# Patient Record
Sex: Male | Born: 1970 | Race: White | Hispanic: No | Marital: Single | Smoking: Former smoker
Health system: Southern US, Community
[De-identification: ages and names within clinical notes are randomized; demographics above are authoritative.]

## PROBLEM LIST (undated history)

## (undated) DIAGNOSIS — F32A Depression, unspecified: Secondary | ICD-10-CM

## (undated) DIAGNOSIS — F329 Major depressive disorder, single episode, unspecified: Secondary | ICD-10-CM

## (undated) DIAGNOSIS — F1910 Other psychoactive substance abuse, uncomplicated: Secondary | ICD-10-CM

## (undated) DIAGNOSIS — K4090 Unilateral inguinal hernia, without obstruction or gangrene, not specified as recurrent: Secondary | ICD-10-CM

## (undated) DIAGNOSIS — J942 Hemothorax: Secondary | ICD-10-CM

## (undated) HISTORY — PX: INCISION AND DRAINAGE ABSCESS: SHX5864

---

## 2008-11-16 ENCOUNTER — Emergency Department (HOSPITAL_COMMUNITY): Admission: EM | Admit: 2008-11-16 | Discharge: 2008-11-17 | Payer: Self-pay | Admitting: Emergency Medicine

## 2008-11-17 ENCOUNTER — Encounter (INDEPENDENT_AMBULATORY_CARE_PROVIDER_SITE_OTHER): Payer: Self-pay | Admitting: Otolaryngology

## 2008-11-17 ENCOUNTER — Inpatient Hospital Stay (HOSPITAL_COMMUNITY): Admission: RE | Admit: 2008-11-17 | Discharge: 2008-11-20 | Payer: Self-pay | Admitting: Otolaryngology

## 2009-12-22 ENCOUNTER — Emergency Department (HOSPITAL_COMMUNITY): Admission: EM | Admit: 2009-12-22 | Discharge: 2009-12-22 | Payer: Self-pay | Admitting: Family Medicine

## 2010-05-18 LAB — BASIC METABOLIC PANEL
BUN: 14 mg/dL (ref 6–23)
CO2: 26 mEq/L (ref 19–32)
CO2: 27 mEq/L (ref 19–32)
Calcium: 8.7 mg/dL (ref 8.4–10.5)
Calcium: 9.3 mg/dL (ref 8.4–10.5)
Chloride: 103 mEq/L (ref 96–112)
Creatinine, Ser: 0.68 mg/dL (ref 0.4–1.5)
GFR calc Af Amer: >60 mL/min (ref >60)
GFR calc non Af Amer: >60 mL/min (ref >60)
GFR calc non Af Amer: >60 mL/min (ref >60)
Glucose, Bld: 89 mg/dL (ref 70–99)
Potassium: 3.4 mEq/L — ABNORMAL LOW (ref 3.5–5.1)

## 2010-05-18 LAB — CULTURE, ROUTINE-ABSCESS

## 2010-05-18 LAB — CBC
HCT: 35.3 % — ABNORMAL LOW (ref 39.0–52.0)
Hemoglobin: 12.3 g/dL — ABNORMAL LOW (ref 13.0–17.0)
Hemoglobin: 12.8 g/dL — ABNORMAL LOW (ref 13.0–17.0)
MCV: 90.6 fL (ref 78.0–100.0)
Platelets: 180 10*3/uL (ref 150–400)
Platelets: 198 10*3/uL (ref 150–400)
RBC: 3.9 MIL/uL — ABNORMAL LOW (ref 4.22–5.81)
RDW: 13 % (ref 11.5–15.5)
WBC: 12.4 10*3/uL — ABNORMAL HIGH (ref 4.0–10.5)

## 2010-05-18 LAB — DIFFERENTIAL
Eosinophils Absolute: 0 10*3/uL (ref 0.0–0.7)
Monocytes Relative: 3 % (ref 3–12)
Neutrophils Relative %: 88 % — ABNORMAL HIGH (ref 43–77)

## 2010-05-18 LAB — ANAEROBIC CULTURE

## 2011-03-29 ENCOUNTER — Encounter (HOSPITAL_COMMUNITY): Payer: Self-pay | Admitting: Emergency Medicine

## 2011-03-29 ENCOUNTER — Emergency Department (HOSPITAL_COMMUNITY)
Admission: EM | Admit: 2011-03-29 | Discharge: 2011-03-29 | Discharge status: Against Medical Advice | Payer: Medicaid Other | Attending: Emergency Medicine | Admitting: Emergency Medicine

## 2011-03-29 DIAGNOSIS — R1110 Vomiting, unspecified: Secondary | ICD-10-CM | POA: Insufficient documentation

## 2011-03-29 MED ORDER — SODIUM CHLORIDE 0.9 % IV BOLUS (SEPSIS): 1000.0000 mL | Freq: Once | INTRAVENOUS | Status: DC

## 2011-03-29 MED ORDER — ONDANSETRON HCL 4 MG/2ML IJ SOLN: 4.0000 mg | Freq: Once | INTRAMUSCULAR | Status: DC

## 2011-03-29 MED ORDER — SODIUM CHLORIDE 0.9 % IV SOLN: Freq: Once | INTRAVENOUS | Status: DC

## 2011-03-29 NOTE — ED Notes (Signed)
Pt c/o N/V/D and burning with urination x 2 days; pt c/o generalized abd pain

## 2012-02-03 ENCOUNTER — Encounter (HOSPITAL_COMMUNITY): Payer: Self-pay | Admitting: *Deleted

## 2012-02-03 ENCOUNTER — Emergency Department (HOSPITAL_COMMUNITY)
Admission: EM | Admit: 2012-02-03 | Discharge: 2012-02-03 | Disposition: A | Discharge status: Home / Self Care | Payer: BC Managed Care – PPO | Attending: Emergency Medicine | Admitting: Emergency Medicine

## 2012-02-03 ENCOUNTER — Emergency Department (HOSPITAL_COMMUNITY): Payer: BC Managed Care – PPO

## 2012-02-03 DIAGNOSIS — S81009A Unspecified open wound, unspecified knee, initial encounter: Secondary | ICD-10-CM | POA: Insufficient documentation

## 2012-02-03 DIAGNOSIS — S91009A Unspecified open wound, unspecified ankle, initial encounter: Secondary | ICD-10-CM | POA: Insufficient documentation

## 2012-02-03 DIAGNOSIS — F17200 Nicotine dependence, unspecified, uncomplicated: Secondary | ICD-10-CM | POA: Insufficient documentation

## 2012-02-03 DIAGNOSIS — Y9389 Activity, other specified: Secondary | ICD-10-CM | POA: Insufficient documentation

## 2012-02-03 DIAGNOSIS — Y92009 Unspecified place in unspecified non-institutional (private) residence as the place of occurrence of the external cause: Secondary | ICD-10-CM | POA: Insufficient documentation

## 2012-02-03 DIAGNOSIS — W268XXA Contact with other sharp object(s), not elsewhere classified, initial encounter: Secondary | ICD-10-CM | POA: Insufficient documentation

## 2012-02-03 DIAGNOSIS — Z23 Encounter for immunization: Secondary | ICD-10-CM | POA: Insufficient documentation

## 2012-02-03 DIAGNOSIS — S81812A Laceration without foreign body, left lower leg, initial encounter: Secondary | ICD-10-CM

## 2012-02-03 MED ORDER — TETANUS-DIPHTH-ACELL PERTUSSIS 5-2.5-18.5 LF-MCG/0.5 IM SUSP
0.5000 mL | Freq: Once | INTRAMUSCULAR | Status: AC
  Administered 2012-02-03: 0.5 mL via INTRAMUSCULAR
  Filled 2012-02-03: qty 0.5

## 2012-02-03 NOTE — ED Notes (Signed)
Pt discharged home. Had no further questions. 

## 2012-02-03 NOTE — ED Notes (Signed)
Pt returned to exam room. Resting quietly at the time. Laceration covered with dressing, bleeding controlled. Family at bedside.

## 2012-02-03 NOTE — ED Notes (Signed)
Pt transported to xray 

## 2012-02-03 NOTE — ED Notes (Signed)
Pt was moving tin in the yard and got his left lower leg cut.  Pt has stopped bleeding and has dressing intact.

## 2012-02-03 NOTE — ED Provider Notes (Signed)
Medical screening examination/treatment/procedure(s) were performed by non-physician practitioner and as supervising physician I was immediately available for consultation/collaboration.   Gwyneth Sprout, MD 02/03/12 [phone]

## 2012-02-03 NOTE — ED Notes (Signed)
Pt presents to department for evaluation of left lower leg laceration. States he was moving a piece of tin in yard when he cut his leg. 1inch laceration noted, bleeding controlled at the time. Pt states soreness to lower leg, 5/10 at the time. Able to wiggle digits. Pedal pulses present. Sensation intact. No signs of distress noted at the time.

## 2012-02-03 NOTE — ED Provider Notes (Signed)
History     CSN: [id number]  Arrival date & time 02/03/12  1022   First MD Initiated Contact with Patient 02/03/12 1031      Chief Complaint  Patient presents with  . Extremity Laceration    (Consider location/radiation/quality/duration/timing/severity/associated sxs/prior treatment) HPI Richard Harrington is a 41 y.o. male who presents to ed complaining of a laceration to the left lower leg. States cut it with a metal tin sheet that was in his yard. Denies numbness or weakness distal tot he injury. Bleeding controlled with pressure. known tetanus. No other complaints.     History reviewed. No pertinent past medical history.  History reviewed. No pertinent past surgical history.  No family history on file.  History  Substance Use Topics  . Smoking status: Current Some Day Smoker  . Smokeless tobacco: Not on file  . Alcohol Use: Yes     Comment: occasional      Review of Systems  Constitutional: Negative for fever and chills.  Respiratory: Negative.   Cardiovascular: Negative.   Musculoskeletal: Positive for arthralgias.  Skin: Positive for wound.  Neurological: Negative for weakness and numbness.    Allergies  Review of patient's allergies indicates no known allergies.  Home Medications  No current outpatient prescriptions on file.  BP 119/74  Pulse 112  Temp 98 F (36.7 C) (Oral)  Resp 18  SpO2 99%  Physical Exam  Nursing note and vitals reviewed. Constitutional: He appears well-developed and well-nourished. No distress.  Eyes: Conjunctivae normal are normal.  Cardiovascular: Normal rate, regular rhythm and normal heart sounds.   Pulmonary/Chest: Effort normal and breath sounds normal. No respiratory distress. He has no wheezes. He has no rales.  Musculoskeletal:       Full rom of left ankle. 5/5 strength with planter, dorsiflexion, inversion an eversion. Good strength with rom of all toes. Normal sensation distal to the laceration. 5cm laceration to  the left lower anterior shin. Hemostatic. Normal dorsal pedal pulse on left leg   Neurological: He is alert.  Skin: Skin is warm and dry.    ED Course  Procedures (including critical care time)  Pt with laceration to the left lower leg. Neurovascularly intact. Will get x-ray to r/o foreign body and bone penetration.   Dg Tibia/fibula Left  02/03/2012  *RADIOLOGY REPORT*  Clinical Data: Laceration  LEFT TIBIA AND FIBULA - 2 VIEW  Comparison: None.  Findings: Four views of the left tibia-fibula submitted.  No acute fracture or subluxation.  No radiopaque foreign body.  IMPRESSION: No acute fracture or subluxation.   Original Report Authenticated By: Natasha Mead, M.D.    LACERATION REPAIR Performed by: Lottie Mussel Authorized by: Jaynie Crumble A Consent: Verbal consent obtained. Risks and benefits: risks, benefits and alternatives were discussed Consent given by: patient Patient identity confirmed: provided demographic data Prepped and Draped in normal sterile fashion Wound explored  Laceration Location: lower left shin  Laceration Length: 5cm  No Foreign Bodies seen or palpated  Anesthesia: local infiltration  Local anesthetic: lidocaine 2% w/ epinephrine  Anesthetic total: 3 ml  Irrigation method: syringe Amount of cleaning: standard  Skin closure: prolene 4.0  Number of sutures: 5  Technique: simple interrupted  Patient tolerance: Patient tolerated the procedure well with no immediate complications.    1. Laceration of left lower leg       MDM  Pt with laceration to the left lower anterior shin. NO signs of tendon, nerve, vascular injury distally. Negative x-ray. Laceration reparied.  Will d/c home with follow up as needed. Tetanus updated.          Lottie Mussel, PA 02/03/12 1215

## 2013-05-05 ENCOUNTER — Emergency Department: Payer: Self-pay | Admitting: Emergency Medicine

## 2013-05-05 LAB — COMPREHENSIVE METABOLIC PANEL
ALK PHOS: 93 U/L
ANION GAP: 11 (ref 7–16)
AST: 28 U/L (ref 15–37)
Albumin: 3.9 g/dL (ref 3.4–5.0)
BUN: 9 mg/dL (ref 7–18)
Bilirubin,Total: 0.6 mg/dL (ref 0.2–1.0)
CO2: 24 mmol/L (ref 21–32)
Calcium, Total: 8.8 mg/dL (ref 8.5–10.1)
Chloride: 102 mmol/L (ref 98–107)
Creatinine: 0.87 mg/dL (ref 0.60–1.30)
EGFR (African American): >60
EGFR (Non-African Amer.): >60
Glucose: 119 mg/dL — ABNORMAL HIGH (ref 65–99)
Osmolality: 274 (ref 275–301)
Potassium: 2.8 mmol/L — ABNORMAL LOW (ref 3.5–5.1)
SGPT (ALT): 24 U/L (ref 12–78)
Sodium: 137 mmol/L (ref 136–145)
TOTAL PROTEIN: 7.4 g/dL (ref 6.4–8.2)

## 2013-05-05 LAB — CBC
HCT: 42.5 % (ref 40.0–52.0)
HGB: 14.5 g/dL (ref 13.0–18.0)
MCH: 30.3 pg (ref 26.0–34.0)
MCHC: 34 g/dL (ref 32.0–36.0)
MCV: 89 fL (ref 80–100)
Platelet: 181 10*3/uL (ref 150–440)
RBC: 4.78 10*6/uL (ref 4.40–5.90)
RDW: 14.5 % (ref 11.5–14.5)
WBC: 13.4 10*3/uL — ABNORMAL HIGH (ref 3.8–10.6)

## 2013-05-05 LAB — ETHANOL: Ethanol %: <0.003 % (ref 0.000–0.080)

## 2013-11-03 ENCOUNTER — Other Ambulatory Visit: Payer: Self-pay | Admitting: Neurosurgery

## 2013-11-03 DIAGNOSIS — G930 Cerebral cysts: Secondary | ICD-10-CM

## 2016-06-19 ENCOUNTER — Telehealth: Payer: Self-pay | Admitting: *Deleted

## 2016-06-19 NOTE — Telephone Encounter (Signed)
Called patient to schedule an appointment, patient was driving and stated he would call back to make appointment when he knows his schedule. Referral  Dr. Gwenevere GhaziNiemeyerfor Inguinal hernia, notes in book.

## 2016-06-20 ENCOUNTER — Encounter: Payer: Self-pay | Admitting: *Deleted

## 2016-06-21 ENCOUNTER — Ambulatory Visit: Payer: Self-pay | Admitting: General Surgery

## 2016-06-27 ENCOUNTER — Ambulatory Visit (INDEPENDENT_AMBULATORY_CARE_PROVIDER_SITE_OTHER): Payer: BLUE CROSS/BLUE SHIELD | Admitting: General Surgery

## 2016-06-27 ENCOUNTER — Encounter: Payer: Self-pay | Admitting: General Surgery

## 2016-06-27 VITALS — BP 116/66 | HR 92 | Resp 12 | Ht 69.0 in | Wt 128.0 lb

## 2016-06-27 DIAGNOSIS — K4090 Unilateral inguinal hernia, without obstruction or gangrene, not specified as recurrent: Secondary | ICD-10-CM

## 2016-06-27 NOTE — Progress Notes (Signed)
Patient ID: Richard Harrington, male   DOB: [birth year], 46 y.o.   MRN: [id number]  Chief Complaint  Patient presents with  . Hernia    HPI Richard Harrington is a 46 y.o. male.  Patient here today for an evaluation of a possible hernia.  States that he has noticed a knot left groin for about 1 month.  It does seem to be causing some discomfort to that area. He states the knot will go down after he lays down. No nausea, vomiting, constipation noted. He does admit to loose stools. He has a new position at work and with his prior positions he did heavy lifting. He also moved recently.  He works night shift. He also has right shoulder pain as well.  HPI  No past medical history on file.  Past Surgical History:  Procedure Laterality Date  . INCISION AND DRAINAGE ABSCESS     tonsil abscess    Family History  Problem Relation Age of Onset  . Colon cancer Neg Hx   . Breast cancer Neg Hx     Social History Social History  Substance Use Topics  . Smoking status: Current Every Day Smoker    Packs/day: 0.50    Years: 24.00    Types: Cigarettes  . Smokeless tobacco: Never Used  . Alcohol use Yes     Comment: occasional    No Known Allergies  Current Outpatient Prescriptions  Medication Sig Dispense Refill  . meloxicam (MOBIC) 7.5 MG tablet Take 7.5 mg by mouth 2 (two) times daily.     No current facility-administered medications for this visit.     Review of Systems Review of Systems  Constitutional: Negative.   Respiratory: Negative.   Cardiovascular: Negative.   Gastrointestinal: Positive for diarrhea. Negative for constipation.    Blood pressure 116/66, pulse 92, resp. rate 12, height 5\' 9"  (1.753 m), weight 128 lb (58.1 kg).  Physical Exam Physical Exam  Constitutional: He is oriented to person, place, and time. He appears well-developed and well-nourished.  HENT:  Mouth/Throat: Oropharynx is clear and moist.  Eyes: Conjunctivae are normal. No scleral icterus.  Neck:  Neck supple.  Cardiovascular: Normal rate, regular rhythm and normal heart sounds.   Pulmonary/Chest: Effort normal and breath sounds normal.  Abdominal: Soft. Normal appearance and bowel sounds are normal. There is no tenderness. A hernia is present. Hernia confirmed positive in the left inguinal area.  Lymphadenopathy:    He has no cervical adenopathy.  Neurological: He is alert and oriented to person, place, and time.  Skin: Skin is warm and dry.  Psychiatric: His behavior is normal.  Small to medium sized hernia mostly noted when he stands up.  Data Reviewed Progress notes  Assessment    Left inguinal hernia, symptomatic    Plan    Hernia precautions and incarceration were discussed with the patient. If they develop symptoms of an incarcerated hernia, they were encouraged to seek prompt medical attention.  I have recommended repair of the hernia using mesh on an outpatient basis in the near future. The risk of infection was reviewed. The role of prosthetic mesh to minimize the risk of recurrence was reviewed.  Postoperatively: May return to work in one week, no heavy lifting or exertional activity for 2 weeks.      HPI, Physical Exam, Assessment and Plan have been scribed under the direction and in the presence of Kathreen Cosier, MD  Dorathy Daft, RN  I have completed the exam and  reviewed the above documentation for accuracy and completeness.  I agree with the above.  [employer] has been used and any errors in dictation or transcription are unintentional.  Frimet Durfee G. Evette CristalSankar, M.D., F.A.C.S.  [name],[name] 06/27/2016, 7:40 PM  Patient wishes to check his work schedule and then call the office back to arrange a date for surgery. Surgery instructions were reviewed and provided to the patient today.   [name], CMA

## 2016-06-27 NOTE — Patient Instructions (Addendum)
Postoperatively: May return to work in one week, no heavy lifting or exertional activity for 2 weeks.  Inguinal Hernia, Adult An inguinal hernia is when fat or the intestines push through the area where the leg meets the lower belly (groin) and make a rounded lump (bulge). This condition happens over time. There are three types of inguinal hernias. These types include:  Hernias that can be pushed back into the belly (are reducible).  Hernias that cannot be pushed back into the belly (are incarcerated).  Hernias that cannot be pushed back into the belly and lose their blood supply (get strangulated). This type needs emergency surgery. Follow these instructions at home: Lifestyle   Drink enough fluid to keep your urine (pee) clear or pale yellow.  Eat plenty of fruits, vegetables, and whole grains. These have a lot of fiber. Talk with your doctor if you have questions.  Avoid lifting heavy objects.  Avoid standing for long periods of time.  Do not use tobacco products. These include cigarettes, chewing tobacco, or e-cigarettes. If you need help quitting, ask your doctor.  Try to stay at a healthy weight. General instructions   Do not try to force the hernia back in.  Watch your hernia for any changes in color or size. Let your doctor know if there are any changes.  Take over-the-counter and prescription medicines only as told by your doctor.  Keep all follow-up visits as told by your doctor. This is important. Contact a doctor if:  You have a fever.  You have new symptoms.  Your symptoms get worse. Get help right away if:  The area where the legs meets the lower belly has:  Pain that gets worse suddenly.  A bulge that gets bigger suddenly and does not go down.  A bulge that turns red or purple.  A bulge that is painful to the touch.  You are a man and your scrotum:  Suddenly feels painful.  Suddenly changes in size.  You feel sick to your stomach (nauseous) and  this feeling does not go away.  You throw up (vomit) and this keeps happening.  You feel your heart beating a lot more quickly than normal.  You cannot poop (have a bowel movement) or pass gas. This information is not intended to replace advice given to you by your health care provider. Make sure you discuss any questions you have with your health care provider. Document Released: 03/01/2006 Document Revised: 07/07/2015 Document Reviewed: 12/09/2013 Elsevier Interactive Patient Education  2017 [employer]

## 2016-07-02 ENCOUNTER — Encounter: Payer: Self-pay | Admitting: General Surgery

## 2016-07-03 ENCOUNTER — Telehealth: Payer: Self-pay

## 2016-07-03 NOTE — Telephone Encounter (Signed)
Patient called to schedule his surgery for left hernia repair. The [facility]. He will pre admit by phone. Message to Dr Evette CristalSankar to see if the patient needs to be seen here prior. I let the patient know we would contact him if he needed to be seen prior to surgery. The patient is aware of date and instructions.

## 2016-07-29 ENCOUNTER — Emergency Department: Payer: BLUE CROSS/BLUE SHIELD

## 2016-07-29 ENCOUNTER — Inpatient Hospital Stay
Admission: EM | Admit: 2016-07-29 | Discharge: 2016-08-01 | DRG: 918 | Disposition: A | Discharge status: Other Acute Inpatient Hospital | Payer: BLUE CROSS/BLUE SHIELD | Attending: Internal Medicine | Admitting: Internal Medicine

## 2016-07-29 ENCOUNTER — Encounter: Payer: Self-pay | Admitting: Emergency Medicine

## 2016-07-29 DIAGNOSIS — I959 Hypotension, unspecified: Secondary | ICD-10-CM | POA: Diagnosis present

## 2016-07-29 DIAGNOSIS — T50902A Poisoning by unspecified drugs, medicaments and biological substances, intentional self-harm, initial encounter: Secondary | ICD-10-CM

## 2016-07-29 DIAGNOSIS — Z87891 Personal history of nicotine dependence: Secondary | ICD-10-CM | POA: Diagnosis not present

## 2016-07-29 DIAGNOSIS — T6591XA Toxic effect of unspecified substance, accidental (unintentional), initial encounter: Secondary | ICD-10-CM | POA: Diagnosis present

## 2016-07-29 DIAGNOSIS — R45851 Suicidal ideations: Secondary | ICD-10-CM | POA: Diagnosis present

## 2016-07-29 DIAGNOSIS — K4090 Unilateral inguinal hernia, without obstruction or gangrene, not specified as recurrent: Secondary | ICD-10-CM | POA: Insufficient documentation

## 2016-07-29 DIAGNOSIS — J9383 Other pneumothorax: Secondary | ICD-10-CM | POA: Diagnosis present

## 2016-07-29 DIAGNOSIS — Z791 Long term (current) use of non-steroidal anti-inflammatories (NSAID): Secondary | ICD-10-CM

## 2016-07-29 DIAGNOSIS — T428X2A Poisoning by antiparkinsonism drugs and other central muscle-tone depressants, intentional self-harm, initial encounter: Secondary | ICD-10-CM | POA: Diagnosis present

## 2016-07-29 DIAGNOSIS — J939 Pneumothorax, unspecified: Secondary | ICD-10-CM

## 2016-07-29 DIAGNOSIS — F322 Major depressive disorder, single episode, severe without psychotic features: Secondary | ICD-10-CM | POA: Diagnosis present

## 2016-07-29 DIAGNOSIS — R001 Bradycardia, unspecified: Secondary | ICD-10-CM | POA: Diagnosis present

## 2016-07-29 DIAGNOSIS — R441 Visual hallucinations: Secondary | ICD-10-CM | POA: Diagnosis present

## 2016-07-29 DIAGNOSIS — T43622A Poisoning by amphetamines, intentional self-harm, initial encounter: Secondary | ICD-10-CM | POA: Diagnosis present

## 2016-07-29 DIAGNOSIS — Z09 Encounter for follow-up examination after completed treatment for conditions other than malignant neoplasm: Secondary | ICD-10-CM

## 2016-07-29 DIAGNOSIS — F1510 Other stimulant abuse, uncomplicated: Secondary | ICD-10-CM | POA: Diagnosis present

## 2016-07-29 HISTORY — DX: Other psychoactive substance abuse, uncomplicated: F19.10

## 2016-07-29 HISTORY — DX: Unilateral inguinal hernia, without obstruction or gangrene, not specified as recurrent: K40.90

## 2016-07-29 HISTORY — DX: Depression, unspecified: F32.A

## 2016-07-29 HISTORY — DX: Major depressive disorder, single episode, unspecified: F32.9

## 2016-07-29 LAB — ETHANOL

## 2016-07-29 LAB — TROPONIN I: Troponin I: <0.03 ng/mL (ref <0.03)

## 2016-07-29 LAB — COMPREHENSIVE METABOLIC PANEL
ALK PHOS: 75 U/L (ref 38–126)
ALT: 18 U/L (ref 17–63)
AST: 27 U/L (ref 15–41)
Albumin: 4.2 g/dL (ref 3.5–5.0)
Anion gap: 9 (ref 5–15)
BILIRUBIN TOTAL: 1.4 mg/dL — AB (ref 0.3–1.2)
BUN: 36 mg/dL — AB (ref 6–20)
CALCIUM: 9 mg/dL (ref 8.9–10.3)
CHLORIDE: 100 mmol/L — AB (ref 101–111)
CO2: 22 mmol/L (ref 22–32)
CREATININE: 1.1 mg/dL (ref 0.61–1.24)
GFR calc Af Amer: >60 mL/min (ref >60)
Glucose, Bld: 159 mg/dL — ABNORMAL HIGH (ref 65–99)
Potassium: 4.2 mmol/L (ref 3.5–5.1)
Sodium: 131 mmol/L — ABNORMAL LOW (ref 135–145)
Total Protein: 7.4 g/dL (ref 6.5–8.1)

## 2016-07-29 LAB — CBC
HCT: 44.5 % (ref 40.0–52.0)
Hemoglobin: 15.6 g/dL (ref 13.0–18.0)
MCH: 30.4 pg (ref 26.0–34.0)
MCHC: 35 g/dL (ref 32.0–36.0)
MCV: 86.8 fL (ref 80.0–100.0)
PLATELETS: 196 10*3/uL (ref 150–440)
RBC: 5.13 MIL/uL (ref 4.40–5.90)
RDW: 13 % (ref 11.5–14.5)
WBC: 9.5 10*3/uL (ref 3.8–10.6)

## 2016-07-29 LAB — SALICYLATE LEVEL: Salicylate Lvl: <7 mg/dL (ref 2.8–30.0)

## 2016-07-29 LAB — ACETAMINOPHEN LEVEL: Acetaminophen (Tylenol), Serum: <10 ug/mL — ABNORMAL LOW (ref 10–30)

## 2016-07-29 MED ORDER — LIDOCAINE HCL (PF) 1 % IJ SOLN
5.0000 mL | Freq: Once | INTRAMUSCULAR | Status: AC
  Administered 2016-07-29: 5 mL via INTRADERMAL

## 2016-07-29 MED ORDER — LIDOCAINE HCL (PF) 1 % IJ SOLN
INTRAMUSCULAR | Status: AC
  Administered 2016-07-29: 5 mL via INTRADERMAL
  Filled 2016-07-29: qty 5

## 2016-07-29 NOTE — H&P (Signed)
[facility] - [place] at [facility]   PATIENT NAME: Richard Harrington    MR#:  [id number]  DATE OF BIRTH:  [birth year]  DATE OF ADMISSION:  07/29/2016  PRIMARY CARE PHYSICIAN: Evelene Croon, MD   REQUESTING/REFERRING PHYSICIAN: Cyril Loosen, MD  CHIEF COMPLAINT:   Chief Complaint  Patient presents with  . Drug Overdose    HISTORY OF PRESENT ILLNESS:  [name]  is a 46 y.o. male who presents with Drug overdose. 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 He came in lethargic, and was found in the ED to have a pneumothorax. He was placed under IVC, chest tube was placed, and hospitalists were called for admission.  PAST MEDICAL HISTORY:   Past Medical History:  Diagnosis Date  . Depression   . Inguinal hernia    left  . Substance abuse     PAST SURGICAL HISTORY:   Past Surgical History:  Procedure Laterality Date  . INCISION AND DRAINAGE ABSCESS     tonsil abscess    SOCIAL HISTORY:   Social History  Substance Use Topics  . Smoking status: Former Smoker    Packs/day: 0.50    Years: 24.00    Types: Cigarettes  . Smokeless tobacco: Never Used  . Alcohol use Yes     Comment: occasional    FAMILY HISTORY:   Family History  Problem Relation Age of Onset  . Colon cancer Neg Hx   . Breast cancer Neg Hx     DRUG ALLERGIES:  No Known Allergies  MEDICATIONS AT HOME:   Prior to Admission medications   Medication Sig Start Date End Date Taking? Authorizing Provider  meloxicam (MOBIC) 7.5 MG tablet Take 7.5 mg by mouth 2 (two) times daily.   Yes [provider]  TIZANIDINE HCL PO Take by mouth.   Yes [provider]    REVIEW OF SYSTEMS:  Review of Systems  Constitutional: Negative for  chills, fever, malaise/fatigue and weight loss.  HENT: Negative for ear pain, hearing loss and tinnitus.   Eyes: Negative for blurred vision, double vision, pain and redness.  Respiratory: Negative for cough, hemoptysis and shortness of breath.   Cardiovascular: Negative for chest pain, palpitations, orthopnea and leg swelling.  Gastrointestinal: Negative for abdominal pain, constipation, diarrhea, nausea and vomiting.  Genitourinary: Negative for dysuria, frequency and hematuria.  Musculoskeletal: Negative for back pain, joint pain and neck pain.  Skin:       No acne, rash, or lesions  Neurological: Negative for dizziness, tremors, focal weakness and weakness.  Endo/Heme/Allergies: Negative for polydipsia. Does not bruise/bleed easily.  Psychiatric/Behavioral: Positive for depression. The patient is not nervous/anxious and does not have insomnia.      VITAL SIGNS:   Vitals:   07/29/16 2230 07/29/16 2245 07/29/16 2300 07/29/16 2315  BP: 109/75 112/76 104/70 100/73  Pulse: (!) 48 (!) 47 78   Resp: 16 12 (!) 24 13  Temp:      TempSrc:      SpO2: 100% 100% 100%   Weight:      Height:       Wt Readings from Last 3 Encounters:  07/29/16 59 kg (130 lb)  06/27/16 58.1 kg (128 lb)    PHYSICAL EXAMINATION:  Physical Exam  Vitals reviewed. Constitutional: He is oriented to person, place, and time. He appears well-developed and well-nourished. No distress.  HENT:  Head: Normocephalic and atraumatic.  Mouth/Throat: Oropharynx is clear and moist.  Eyes: Conjunctivae and EOM are normal. Pupils are equal, round, and reactive to light. No scleral icterus.  Neck: Normal range of motion. Neck supple. No JVD present. No thyromegaly present.  Cardiovascular: Normal rate, regular rhythm and intact distal pulses.  Exam reveals no gallop and no friction rub.   No murmur heard. Respiratory: Effort normal and breath sounds normal. No respiratory distress. He has no wheezes. He has no rales.   Right chest tube in place  GI: Soft. Bowel sounds are normal. He exhibits no distension. There is no tenderness.  Musculoskeletal: Normal range of motion. He exhibits no edema.  No arthritis, no gout  Lymphadenopathy:    He has no cervical adenopathy.  Neurological: He is alert and oriented to person, place, and time. No cranial nerve deficit.  No dysarthria, no aphasia  Skin: Skin is warm and dry. No rash noted. No erythema.  Psychiatric: He has a normal mood and affect. His behavior is normal. Judgment and thought content normal.    LABORATORY PANEL:   CBC  Recent Labs Lab 07/29/16 2137  WBC 9.5  HGB 15.6  HCT 44.5  PLT 196   ------------------------------------------------------------------------------------------------------------------  Chemistries   Recent Labs Lab 07/29/16 2137  NA 131*  K 4.2  CL 100*  CO2 22  GLUCOSE 159*  BUN 36*  CREATININE 1.10  CALCIUM 9.0  AST 27  ALT 18  ALKPHOS 75  BILITOT 1.4*   ------------------------------------------------------------------------------------------------------------------  Cardiac Enzymes  Recent Labs Lab 07/29/16 2137  TROPONINI <0.03   ------------------------------------------------------------------------------------------------------------------  RADIOLOGY:  Dg Chest Portable 1 View  Result Date: 07/29/2016 CLINICAL DATA:  46 year old male with right-sided pneumothorax status post chest tube placement. EXAM: PORTABLE CHEST 1 VIEW COMPARISON:  Earlier radiograph dated 07/29/2016 FINDINGS: There has been interval placement of a right-sided chest tube with tip projecting over the right seventh intercostal space. There has been interval decrease in the size of the right-sided pneumothorax now measuring approximately 2 cm from the apical pleural (previously 4.7 cm). The lungs are clear. There is no pleural effusion. The cardiac silhouette is within normal limits. No acute osseous pathology. IMPRESSION:  Interval placement of a right chest tube with decrease in the size of the right apical pneumothorax. Continued follow-up recommended. Electronically Signed   By: Elgie Collard M.D.   On: 07/29/2016 23:21   Dg Chest Portable 1 View  Result Date: 07/29/2016 CLINICAL DATA:  46 year old male with overdose and unresponsiveness. EXAM: PORTABLE CHEST 1 VIEW COMPARISON:  None. FINDINGS: There is a right upper lobe pneumothorax measuring approximately 4.7 cm in craniocaudal length from the right apical pleura. The lungs are clear. There is no pleural effusion. The cardiac silhouette is within normal limits. No acute osseous pathology. IMPRESSION: Right apical pneumothorax. These results were called by telephone at the time of interpretation on 07/29/2016 at 10:21 pm to Dr. Jene Every , who verbally acknowledged these results. Electronically Signed   By: Elgie Collard M.D.   On: 07/29/2016 22:23    EKG:   Orders placed or performed during the hospital encounter of 07/29/16  . EKG 12-Lead  . EKG 12-Lead  . ED EKG  . ED EKG    IMPRESSION AND PLAN:  Principal Problem:   Drug overdose - seems to been an attempt to hurt himself, see below.  No need for acute intervention for his overdose, other than supportive care while the drugs wean out of his system. Active Problems:   Substance abuse - patient will need coordination for some sort of long-term treatment for his substance abuse   Suicide attempt [facility](HCC) - psychiatry consult   Pneumothorax, right - chest tube in place  All the records are reviewed and case discussed with ED provider. Management plans discussed with the patient and/or family.  DVT PROPHYLAXIS: SubQ lovenox  GI PROPHYLAXIS: None  ADMISSION STATUS: Inpatient  CODE STATUS: Full Code Status History    This patient does not have a recorded code status. Please follow your organizational policy for patients in this situation.      TOTAL TIME TAKING CARE OF THIS PATIENT: 45  minutes.   Saree Krogh FIELDING 07/29/2016, 11:35 PM  [facility]  Office  [phone]  CC: Primary care physician; Evelene CroonNiemeyer, [place], MD  Note:  This document was prepared using Dragon voice recognition software and may include unintentional dictation errors.

## 2016-07-29 NOTE — ED Notes (Signed)
Patient placed in trendelenburg position due to hypotension.

## 2016-07-29 NOTE — ED Provider Notes (Signed)
[facility] Emergency Department Provider Note   ____________________________________________    I have reviewed the triage vital signs and the nursing notes.   HISTORY  Chief Complaint Drug Overdose     HPI Richard Harrington is a 46 y.o. male to presents after an intentional drug overdose. Patient reportedly took a handful of Zanaflex muscle relaxers and apparently also consumed methamphetamine. He denies other medications or alcohol. He reports he was trying to kill himself. He denies chest pain. No abdominal pain. No vomiting. Mild shortness of breath .He will not elaborate on why he was trying to kill himself.   Past Medical History:  Diagnosis Date  . Depression   . Inguinal hernia    left  . Substance abuse     Patient Active Problem List   Diagnosis Date Noted  . Inguinal hernia     Past Surgical History:  Procedure Laterality Date  . INCISION AND DRAINAGE ABSCESS     tonsil abscess    Prior to Admission medications   Medication Sig Start Date End Date Taking? Authorizing Provider  meloxicam (MOBIC) 7.5 MG tablet Take 7.5 mg by mouth 2 (two) times daily.   Yes [provider]  TIZANIDINE HCL PO Take by mouth.   Yes [provider]     Allergies Patient has no known allergies.  Family History  Problem Relation Age of Onset  . Colon cancer Neg Hx   . Breast cancer Neg Hx     Social History Social History  Substance Use Topics  . Smoking status: Former Smoker    Packs/day: 0.50    Years: 24.00    Types: Cigarettes  . Smokeless tobacco: Never Used  . Alcohol use Yes     Comment: occasional    Review of Systems  Constitutional: Positive dizziness Eyes: No visual changes.  ENT: No throat swelling Cardiovascular: Denies chest pain. Respiratory: As above. Gastrointestinal: No abdominal pain.  No nausea, no vomiting.   Genitourinary: Negative for dysuria. Musculoskeletal: Negative for back  pain. Skin: Negative for rash. Neurological: Negative for headaches    ____________________________________________   PHYSICAL EXAM:  VITAL SIGNS: ED Triage Vitals [07/29/16 2131]  Enc Vitals Group     BP 92/60     Pulse Rate 66     Resp 14     Temp 97.7 F (36.5 C)     Temp Source Oral     SpO2 96 %     Weight 59 kg (130 lb)     Height 1.753 m (5\' 9" )     Head Circumference      Peak Flow      Pain Score      Pain Loc      Pain Edu?      Excl. in GC?     Constitutional: Drowsy but arousable .  Eyes: Conjunctivae are normal. PERRLA Head: Atraumatic. Nose: No congestion/rhinnorhea. Mouth/Throat: Mucous membranes are moist.    Cardiovascular: Normal rate, regular rhythm. Grossly normal heart sounds.  Good peripheral circulation. Respiratory: Normal respiratory effort.  No retractions. Lungs CTAB. Gastrointestinal: Soft and nontender. No distention.  No CVA tenderness. Genitourinary: deferred Musculoskeletal: No lower extremity tenderness nor edema.  Warm and well perfused Neurologic:  Normal speech and language. No gross focal neurologic deficits are appreciated.  Skin:  Skin is warm, dry and intact. No rash noted.   ____________________________________________   LABS (all labs ordered are listed, but only abnormal results are displayed)  Labs Reviewed  COMPREHENSIVE METABOLIC PANEL - Abnormal; Notable for the following:       Result Value   Sodium 131 (*)    Chloride 100 (*)    Glucose, Bld 159 (*)    BUN 36 (*)    Total Bilirubin 1.4 (*)    All other components within normal limits  ACETAMINOPHEN LEVEL - Abnormal; Notable for the following:    Acetaminophen (Tylenol), Serum <10 (*)    All other components within normal limits  ETHANOL  SALICYLATE LEVEL  CBC  TROPONIN I  URINE DRUG SCREEN, QUALITATIVE ([facility] ONLY)  CBG MONITORING, ED   ____________________________________________  EKG  ED ECG REPORT I, Jene Every, the attending physician,  personally viewed and interpreted this ECG.  Date: 07/29/2016  Rate: 63 Rhythm: normal sinus rhythm QRS Axis: normal Intervals: normal ST/T Wave abnormalities: normal   ____________________________________________  RADIOLOGY  Notified by radiology of right apical pneumothorax ____________________________________________   PROCEDURES  Procedure(s) performed: yes  CHEST TUBE INSERTION Date/Time: 07/29/2016 at 11:20 PM Performed by: Jene Every Consent: The procedure was performed in an emergent situation. Imaging studies: imaging studies available Required items: required blood products, implants, devices, and special equipment available Patient identity confirmed: arm band and available demographic data Time out: Immediately prior to procedure a "time out" was called to verify the correct patient, procedure, equipment, support staff and site/side marked as required.  Indications: pneumothorax  Patient sedated: patient already sedated s/p overdose Anesthesia: yes Preparation: skin prepped with Betadine  Placement location: right lateral  Scalpel size: 10 Tube size: Pigtail catheter   Rush of air heard Tube connected to: water seal   Suture material: 3-0 ethilon   Post-insertion x-ray findings: interval decrease in PTX size Patient tolerance: Patient tolerated the procedure well with no immediate complications        Critical Care performed: yes  CRITICAL CARE Performed by: Jene Every   Total critical care time: 35 minutes  Critical care time was exclusive of separately billable procedures and treating other patients.  Critical care was necessary to treat or prevent imminent or life-threatening deterioration.  Critical care was time spent personally by me on the following activities: development of treatment plan with patient and/or surrogate as well as nursing, discussions with consultants, evaluation of patient's response to treatment,  examination of patient, obtaining history from patient or surrogate, ordering and performing treatments and interventions, ordering and review of laboratory studies, ordering and review of radiographic studies, pulse oximetry and re-evaluation of patient's condition. ____________________________________________   INITIAL IMPRESSION / ASSESSMENT AND PLAN / ED COURSE  Pertinent labs & imaging results that were available during my care of the patient were reviewed by me and considered in my medical decision making (see chart for details).  Patient initially presented after intentional drug overdose in attempt to harm himself. He took a" handful" of Zanaflex and "ate meth". Patient given narcan by EMS with no response. Drowsy but arousable. IV fluids, cardiac monitoring. PC contacted by RN. Involuntary commitment paperwork filled out.  Notified by Radiology of right PTX. D/w surgery Dr. Aleen Campi, recommended pigtail insertion  Right pigtail tube inserted as noted above after timeout and confirmation of appropriate side by myself and RN   ____________________________________________   FINAL CLINICAL IMPRESSION(S) / ED DIAGNOSES  Final diagnoses:  Intentional drug overdose, initial encounter (HCC)  Pneumothorax, unspecified type      NEW MEDICATIONS STARTED DURING THIS VISIT:  New Prescriptions   No medications on file  Note:  This document was prepared using Dragon voice recognition software and may include unintentional dictation errors.    Jene Every, MD 07/29/16 [phone]

## 2016-07-29 NOTE — ED Notes (Signed)
MD notified of patient becoming bradycardic.  Patient still able to be aroused and answers questions appropriately.

## 2016-07-29 NOTE — ED Notes (Signed)
Informed consent for procedure completed by patient's mother.

## 2016-07-29 NOTE — ED Triage Notes (Addendum)
Pt comes into the ED via EMS from home c/o overdose on meth and muscle relaxer's.  Patient took unknown amount of meloxicam and tizanidine.  Patient given 2 mg narcan in route with EMS.  Patient alert and able to answer all questions at this time.  Patient has even and unlabored respirations.  H/o substance abuse and has been clean for 9 months until today.  Patient states he wanted to die last night but does not currently have any SI or HI today.

## 2016-07-29 NOTE — ED Notes (Addendum)
Poison control notified of ingestion.  Notified that meloxicam with create GI side effects and tizanidine will cause hypotension and bradycardia.  Possible to have rebound hypertension with the long term effects of tizanidine.  Recommend 6 hours observation and regular care for overdose .  All information confirmed and repeated to Onalee Huaavid, RN with [employer].

## 2016-07-30 ENCOUNTER — Inpatient Hospital Stay: Payer: BLUE CROSS/BLUE SHIELD

## 2016-07-30 DIAGNOSIS — J939 Pneumothorax, unspecified: Secondary | ICD-10-CM

## 2016-07-30 DIAGNOSIS — T50902A Poisoning by unspecified drugs, medicaments and biological substances, intentional self-harm, initial encounter: Secondary | ICD-10-CM

## 2016-07-30 LAB — BASIC METABOLIC PANEL
Anion gap: 4 — ABNORMAL LOW (ref 5–15)
BUN: 26 mg/dL — AB (ref 6–20)
CALCIUM: 8.4 mg/dL — AB (ref 8.9–10.3)
CO2: 22 mmol/L (ref 22–32)
Chloride: 107 mmol/L (ref 101–111)
Creatinine, Ser: 0.86 mg/dL (ref 0.61–1.24)
GFR calc Af Amer: >60 mL/min (ref >60)
GFR calc non Af Amer: >60 mL/min (ref >60)
GLUCOSE: 85 mg/dL (ref 65–99)
Potassium: 4.1 mmol/L (ref 3.5–5.1)
SODIUM: 133 mmol/L — AB (ref 135–145)

## 2016-07-30 LAB — CBC
HCT: 40.8 % (ref 40.0–52.0)
Hemoglobin: 14 g/dL (ref 13.0–18.0)
MCH: 29.9 pg (ref 26.0–34.0)
MCHC: 34.4 g/dL (ref 32.0–36.0)
MCV: 86.9 fL (ref 80.0–100.0)
Platelets: 169 10*3/uL (ref 150–440)
RBC: 4.7 MIL/uL (ref 4.40–5.90)
RDW: 13.2 % (ref 11.5–14.5)
WBC: 7.4 10*3/uL (ref 3.8–10.6)

## 2016-07-30 MED ORDER — SODIUM CHLORIDE 0.9 % IV BOLUS (SEPSIS)
1000.0000 mL | Freq: Once | INTRAVENOUS | Status: AC
  Administered 2016-07-30: 1000 mL via INTRAVENOUS

## 2016-07-30 MED ORDER — KETOROLAC TROMETHAMINE 30 MG/ML IJ SOLN
30.0000 mg | INTRAMUSCULAR | Status: AC
  Administered 2016-07-30: 30 mg via INTRAVENOUS
  Filled 2016-07-30: qty 1

## 2016-07-30 MED ORDER — OXYCODONE-ACETAMINOPHEN 5-325 MG PO TABS
1.0000 | ORAL_TABLET | Freq: Four times a day (QID) | ORAL | Status: DC | PRN
  Administered 2016-07-30 – 2016-07-31 (×5): 1 via ORAL
  Filled 2016-07-30 (×5): qty 1

## 2016-07-30 MED ORDER — ONDANSETRON HCL 4 MG PO TABS: 4.0000 mg | ORAL_TABLET | Freq: Four times a day (QID) | ORAL | Status: DC | PRN

## 2016-07-30 MED ORDER — ENOXAPARIN SODIUM 40 MG/0.4ML ~~LOC~~ SOLN
40.0000 mg | SUBCUTANEOUS | Status: DC
  Administered 2016-07-30 – 2016-08-01 (×3): 40 mg via SUBCUTANEOUS
  Filled 2016-07-30 (×3): qty 0.4

## 2016-07-30 MED ORDER — SODIUM CHLORIDE 0.9 % IV SOLN
INTRAVENOUS | Status: DC
  Administered 2016-07-30 – 2016-08-01 (×4): via INTRAVENOUS

## 2016-07-30 MED ORDER — ONDANSETRON HCL 4 MG/2ML IJ SOLN: 4.0000 mg | Freq: Four times a day (QID) | INTRAMUSCULAR | Status: DC | PRN

## 2016-07-30 MED ORDER — SODIUM CHLORIDE 0.9 % IV BOLUS (SEPSIS): 1000.0000 mL | Freq: Once | INTRAVENOUS | Status: DC

## 2016-07-30 NOTE — ED Notes (Signed)
Dr Sheryle Hailiamond in to assess pt for proper bed placement for admission; pt was originally admitted to ICU and then changed to step down; Dr Sheryle Hailiamond satisfied with pt going to step down

## 2016-07-30 NOTE — ED Notes (Addendum)
Pt resting quietly; IV fluids infusing without difficulty; pt requesting pain medication but understands limited due to medications he ingested earlier; side rails up x 2; sitter at bedside

## 2016-07-30 NOTE — ED Notes (Signed)
Called charge nurse on 2A to discuss pt's improvement in blood pressure over the last 2 hours and their acceptance of pt; she would like one more blood pressure check before accepting the patient; pt is resting well with eyes closed and even unlabored respirations;

## 2016-07-30 NOTE — Progress Notes (Signed)
[employer] [facility] [place] [facility]   PATIENT NAME: Richard Harrington    MR#:  [id number]  DATE OF BIRTH:  05-13-1970  SUBJECTIVE:  CHIEF COMPLAINT:   Chief Complaint  Patient presents with  . Drug Overdose    Came after overdose of muscle relaxants. He also had SOB for atleast 2 weeks, was told by his PMD recently about COPD. Found to have pneumothorax and chest tube is placed by surgical team. Pt was dening any complains.  REVIEW OF SYSTEMS:  CONSTITUTIONAL: No fever, fatigue or weakness.  EYES: No blurred or double vision.  EARS, NOSE, AND THROAT: No tinnitus or ear pain.  RESPIRATORY: No cough, shortness of breath, wheezing or hemoptysis.  CARDIOVASCULAR: No chest pain, orthopnea, edema.  GASTROINTESTINAL: No nausea, vomiting, diarrhea or abdominal pain.  GENITOURINARY: No dysuria, hematuria.  ENDOCRINE: No polyuria, nocturia,  HEMATOLOGY: No anemia, easy bruising or bleeding SKIN: No rash or lesion. MUSCULOSKELETAL: No joint pain or arthritis.   NEUROLOGIC: No tingling, numbness, weakness.  PSYCHIATRY: No anxiety or depression.   ROS  DRUG ALLERGIES:  No Known Allergies  VITALS:  Blood pressure 105/62, pulse 72, temperature 97.7 F (36.5 C), temperature source Oral, resp. rate 18, height 5\' 9"  (1.753 m), weight 59 kg (130 lb), SpO2 97 %.  PHYSICAL EXAMINATION:  GENERAL:  46 y.o.-year-old thin patient lying in the bed with no acute distress.  EYES: Pupils equal, round, reactive to light and accommodation. No scleral icterus. Extraocular muscles intact.  HEENT: Head atraumatic, normocephalic. Oropharynx and nasopharynx clear.  NECK:  Supple, no jugular venous distention. No thyroid enlargement, no tenderness.  LUNGS: Normal breath sounds bilaterally, no wheezing, rales,rhonchi or crepitation. No use of accessory muscles of respiration. Right chest tube present. CARDIOVASCULAR: S1, S2 normal. No murmurs, rubs, or gallops.  ABDOMEN: Soft, nontender,  nondistended. Bowel sounds present. No organomegaly or mass.  EXTREMITIES: No pedal edema, cyanosis, or clubbing.  NEUROLOGIC: Cranial nerves II through XII are intact. Muscle strength 5/5 in all extremities. Sensation intact. Gait not checked.  PSYCHIATRIC: The patient is alert and oriented x 3. Appears depressed. SKIN: No obvious rash, lesion, or ulcer.   Physical Exam LABORATORY PANEL:   CBC  Recent Labs Lab 07/30/16 0854  WBC 7.4  HGB 14.0  HCT 40.8  PLT 169   ------------------------------------------------------------------------------------------------------------------  Chemistries   Recent Labs Lab 07/29/16 2137 07/30/16 0854  NA 131* 133*  K 4.2 4.1  CL 100* 107  CO2 22 22  GLUCOSE 159* 85  BUN 36* 26*  CREATININE 1.10 0.86  CALCIUM 9.0 8.4*  AST 27  --   ALT 18  --   ALKPHOS 75  --   BILITOT 1.4*  --    ------------------------------------------------------------------------------------------------------------------  Cardiac Enzymes  Recent Labs Lab 07/29/16 2137  TROPONINI <0.03   ------------------------------------------------------------------------------------------------------------------  RADIOLOGY:  Portable Chest 1 View  Result Date: 07/30/2016 CLINICAL DATA:  Drug overdose, right-sided pneumothorax. EXAM: PORTABLE CHEST 1 VIEW COMPARISON:  Portable chest x-ray of July 30, 2016 FINDINGS: At approximately 10% right apical pneumothorax persists. There is no pleural effusion. The chest tube tip projects over the posterior aspect of the eighth rib and is stable. The left lung is clear. There is no mediastinal shift. The heart and pulmonary vascularity are normal. IMPRESSION: There is a stable 10% right apical pneumothorax. The small caliber chest tube is also in stable position. Electronically Signed   By: David  [place] M.D.   On: 07/30/2016 09:13   Dg Chest  Port 1 View  Result Date: 07/30/2016 CLINICAL DATA:  Follow-up right-sided  pneumothorax EXAM: PORTABLE CHEST 1 VIEW COMPARISON:  Chest x-ray of July 29, 2016 FINDINGS: There remains a right-sided pneumothorax with the pleural line in the apex lying between the posterior third and fourth ribs. The small caliber chest tube tip lies between the seventh and eighth ribs posteriorly. There is no pleural effusion. There is no mediastinal shift. The left lung is clear. The heart and mediastinal structures are normal. The bony thorax exhibits no acute abnormality. IMPRESSION: Stable approximately 10% right apical pneumothorax. The chest tube is in stable position as well. Electronically Signed   By: David  [place] M.D.   On: 07/30/2016 07:19   Dg Chest Portable 1 View  Result Date: 07/29/2016 CLINICAL DATA:  46 year old male with right-sided pneumothorax status post chest tube placement. EXAM: PORTABLE CHEST 1 VIEW COMPARISON:  Earlier radiograph dated 07/29/2016 FINDINGS: There has been interval placement of a right-sided chest tube with tip projecting over the right seventh intercostal space. There has been interval decrease in the size of the right-sided pneumothorax now measuring approximately 2 cm from the apical pleural (previously 4.7 cm). The lungs are clear. There is no pleural effusion. The cardiac silhouette is within normal limits. No acute osseous pathology. IMPRESSION: Interval placement of a right chest tube with decrease in the size of the right apical pneumothorax. Continued follow-up recommended. Electronically Signed   By: Elgie CollardArash  Radparvar M.D.   On: 07/29/2016 23:21   Dg Chest Portable 1 View  Result Date: 07/29/2016 CLINICAL DATA:  46 year old male with overdose and unresponsiveness. EXAM: PORTABLE CHEST 1 VIEW COMPARISON:  None. FINDINGS: There is a right upper lobe pneumothorax measuring approximately 4.7 cm in craniocaudal length from the right apical pleura. The lungs are clear. There is no pleural effusion. The cardiac silhouette is within normal limits. No acute  osseous pathology. IMPRESSION: Right apical pneumothorax. These results were called by telephone at the time of interpretation on 07/29/2016 at 10:21 pm to Dr. Jene EveryOBERT KINNER , who verbally acknowledged these results. Electronically Signed   By: Elgie CollardArash  Radparvar M.D.   On: 07/29/2016 22:23    ASSESSMENT AND PLAN:   Principal Problem:   Drug overdose Active Problems:   Substance abuse   Suicide attempt (HCC)   Pneumothorax, right   Amphetamine abuse  * Drug overdose - suicidal- methamphatemine and zanaflex  No need for acute intervention for his overdose, other than supportive care while the drugs wean out of his system.  *  Substance abuse - patient will need coordination for some sort of long-term treatment for his substance abuse  *  Suicide attempt [facility](HCC) - psychiatry consult appreciated, tx to psych, once medically stable.  *  Pneumothorax, right - chest tube in place, follow with thoracic surgeon.   All the records are reviewed and case discussed with Care Management/Social Workerr. Management plans discussed with the patient, family and they are in agreement.  CODE STATUS: full.  TOTAL TIME TAKING CARE OF THIS PATIENT: 35 minutes.  Pt's family was present in room during my visit.   POSSIBLE D/C IN 1-2 DAYS, DEPENDING ON CLINICAL CONDITION.   Altamese DillingVACHHANI, Jeniya Flannigan M.D on 07/30/2016   Between 7am to 6pm - Pager - [phone]  After 6pm go to [email or web address] - password [email or web address]  [facility]  Office  [phone]  CC: Primary care physician; Evelene CroonNiemeyer, [place], MD  Note: This dictation was prepared with Dragon dictation along with smaller phrase technology. Any  transcriptional errors that result from this process are unintentional.

## 2016-07-30 NOTE — ED Notes (Signed)
Dr Sheryle Haildiamond returned page regarding pain medication for pt; c/o pain at chest tube insertion site and left shoulder pain; MD to enter order

## 2016-07-30 NOTE — ED Notes (Signed)
Eyes closed, resp even and unlabored; last BP 91/54; sitter remains at bedside

## 2016-07-30 NOTE — Consult Note (Addendum)
[facility] Consult   Reason for Consult:  Consult for 46 year old man brought into the hospital after overdose with suicidal intent Referring Physician:  Anselm Jungling Patient Identification: Richard Harrington MRN:  [id number] Principal Diagnosis: Drug overdose Diagnosis:   Patient Active Problem List   Diagnosis Date Noted  . Severe major depression, single episode, without psychotic features ([place]) [F32.2] 07/31/2016  . Amphetamine abuse [F15.10] 07/30/2016  . Drug overdose [T50.901A] 07/29/2016  . Substance abuse [F19.10] 07/29/2016  . Suicide attempt ([place]) [T14.91XA] 07/29/2016  . Pneumothorax, right [J93.9] 07/29/2016  . Inguinal hernia [K40.90]     Total Time spent with patient: 1 hour  Subjective:   Richard Harrington is a 46 y.o. male patient admitted with "I overdosed on muscle relaxers because I've had enough".  HPI:  Patient interviewed chart reviewed. 46 year old man came into the hospital after overdosing on muscle relaxers. He was at home by himself when he took a large amount of pills in a suicide attempt. When he woke up and found himself still alive then he called his mother for assistance. Patient was found to have a collapsed lung and now has a chest tube. Patient says his mood is been feeling depressed for months. He's been feeling negative and down about himself. Sleep is poor. Appetite poor. Has been having more and more frequent suicidal thoughts. Patient relapsed into methamphetamine use about one or 2 weeks ago and has been using it heavily since then. Prior to that he had been clean for about 8 months but still was depressed. Patient says during the time he was using amphetamines he was having active hallucinations and was feeling confused. Denies homicidal ideation. Not receiving any psychiatric treatment.  Patient reported that he was having visual hallucinations during the time that he was abusing amphetamines. Still having occasional visual  hallucinations in the hospital. No command hallucinations.  Social history: Lives by himself. Not working currently since being in the hospital but was partially holding down a job previously.  Medical history: Currently has a chest tube from a pneumothorax. Otherwise no significant ongoing medical problems previously.  Substance abuse history: Significant history of amphetamine abuse. Had been staying clean for several months going to meetings  Past Psychiatric History: No previous psychiatric hospitalization no previous treatment for anything psychiatric beside his substance abuse. Never been on antidepressant medicine. Denies prior suicide attempts  Risk to Self: Is patient at risk for suicide?: Yes Risk to Others:   Prior Inpatient Therapy:   Prior Outpatient Therapy:    Past Medical History:  Past Medical History:  Diagnosis Date  . Depression   . Inguinal hernia    left  . Substance abuse     Past Surgical History:  Procedure Laterality Date  . INCISION AND DRAINAGE ABSCESS     tonsil abscess   Family History:  Family History  Problem Relation Age of Onset  . Colon cancer Neg Hx   . Breast cancer Neg Hx    Family Psychiatric  History: Does not know of any family history of mental illness Social History:  History  Alcohol Use  . Yes    Comment: occasional     History  Drug Use  . Types: Methamphetamines    Social History   Social History  . Marital status: Single    Spouse name: N/A  . Number of children: N/A  . Years of education: N/A   Social History Main Topics  . Smoking status: Former Smoker  Packs/day: 0.50    Years: 24.00    Types: Cigarettes  . Smokeless tobacco: Never Used  . Alcohol use Yes     Comment: occasional  . Drug use: Yes    Types: Methamphetamines  . Sexual activity: Not Asked   Other Topics Concern  . None   Social History Narrative  . None   Additional Social History:    Allergies:  No Known Allergies  Labs:    Results for orders placed or performed during the hospital encounter of 07/29/16 (from the past 48 hour(s))  Comprehensive metabolic panel     Status: Abnormal   Collection Time: 07/29/16  9:37 PM  Result Value Ref Range   Sodium 131 (L) 135 - 145 mmol/L   Potassium 4.2 3.5 - 5.1 mmol/L    Comment: HEMOLYSIS AT THIS LEVEL MAY AFFECT RESULT   Chloride 100 (L) 101 - 111 mmol/L   CO2 22 22 - 32 mmol/L   Glucose, Bld 159 (H) 65 - 99 mg/dL   BUN 36 (H) 6 - 20 mg/dL   Creatinine, Ser 1.10 0.61 - 1.24 mg/dL   Calcium 9.0 8.9 - 10.3 mg/dL   Total Protein 7.4 6.5 - 8.1 g/dL   Albumin 4.2 3.5 - 5.0 g/dL   AST 27 15 - 41 U/L   ALT 18 17 - 63 U/L   Alkaline Phosphatase 75 38 - 126 U/L   Total Bilirubin 1.4 (H) 0.3 - 1.2 mg/dL   GFR calc non Af Amer >60 >60 mL/min   GFR calc Af Amer >60 >60 mL/min    Comment: (NOTE) The eGFR has been calculated using the CKD EPI equation. This calculation has not been validated in all clinical situations. eGFR's persistently <60 mL/min signify possible Chronic Kidney Disease.    Anion gap 9 5 - 15  Ethanol     Status: None   Collection Time: 07/29/16  9:37 PM  Result Value Ref Range   Alcohol, Ethyl (B) <5 <5 mg/dL    Comment:        LOWEST DETECTABLE LIMIT FOR SERUM ALCOHOL IS 5 mg/dL FOR MEDICAL PURPOSES ONLY   Salicylate level     Status: None   Collection Time: 07/29/16  9:37 PM  Result Value Ref Range   Salicylate Lvl <0.0 2.8 - 30.0 mg/dL  Acetaminophen level     Status: Abnormal   Collection Time: 07/29/16  9:37 PM  Result Value Ref Range   Acetaminophen (Tylenol), Serum <10 (L) 10 - 30 ug/mL    Comment:        THERAPEUTIC CONCENTRATIONS VARY SIGNIFICANTLY. A RANGE OF 10-30 ug/mL MAY BE AN EFFECTIVE CONCENTRATION FOR MANY PATIENTS. HOWEVER, SOME ARE BEST TREATED AT CONCENTRATIONS OUTSIDE THIS RANGE. ACETAMINOPHEN CONCENTRATIONS >150 ug/mL AT 4 HOURS AFTER INGESTION AND >50 ug/mL AT 12 HOURS AFTER INGESTION ARE OFTEN ASSOCIATED  WITH TOXIC REACTIONS.   cbc     Status: None   Collection Time: 07/29/16  9:37 PM  Result Value Ref Range   WBC 9.5 3.8 - 10.6 K/uL   RBC 5.13 4.40 - 5.90 MIL/uL   Hemoglobin 15.6 13.0 - 18.0 g/dL   HCT 44.5 40.0 - 52.0 %   MCV 86.8 80.0 - 100.0 fL   MCH 30.4 26.0 - 34.0 pg   MCHC 35.0 32.0 - 36.0 g/dL   RDW 13.0 11.5 - 14.5 %   Platelets 196 150 - 440 K/uL  Troponin I     Status: None   Collection  Time: 07/29/16  9:37 PM  Result Value Ref Range   Troponin I <0.03 <0.03 ng/mL  HIV antibody (Routine Testing)     Status: None   Collection Time: 07/30/16  8:54 AM  Result Value Ref Range   HIV Screen 4th Generation wRfx Non Reactive Non Reactive    Comment: (NOTE) Performed At: [facility] [place], [place] [id number] Lindon Romp MD OX:[id number]   CBC     Status: None   Collection Time: 07/30/16  8:54 AM  Result Value Ref Range   WBC 7.4 3.8 - 10.6 K/uL   RBC 4.70 4.40 - 5.90 MIL/uL   Hemoglobin 14.0 13.0 - 18.0 g/dL   HCT 40.8 40.0 - 52.0 %   MCV 86.9 80.0 - 100.0 fL   MCH 29.9 26.0 - 34.0 pg   MCHC 34.4 32.0 - 36.0 g/dL   RDW 13.2 11.5 - 14.5 %   Platelets 169 150 - 440 K/uL  Basic metabolic panel     Status: Abnormal   Collection Time: 07/30/16  8:54 AM  Result Value Ref Range   Sodium 133 (L) 135 - 145 mmol/L   Potassium 4.1 3.5 - 5.1 mmol/L   Chloride 107 101 - 111 mmol/L   CO2 22 22 - 32 mmol/L   Glucose, Bld 85 65 - 99 mg/dL   BUN 26 (H) 6 - 20 mg/dL   Creatinine, Ser 0.86 0.61 - 1.24 mg/dL   Calcium 8.4 (L) 8.9 - 10.3 mg/dL   GFR calc non Af Amer >60 >60 mL/min   GFR calc Af Amer >60 >60 mL/min    Comment: (NOTE) The eGFR has been calculated using the CKD EPI equation. This calculation has not been validated in all clinical situations. eGFR's persistently <60 mL/min signify possible Chronic Kidney Disease.    Anion gap 4 (L) 5 - 15    Current Facility-Administered Medications  Medication Dose Route Frequency Provider Last  Rate Last Dose  . 0.9 %  sodium chloride infusion   Intravenous Continuous Lance Coon, MD 75 mL/hr at 07/31/16 [phone]    . enoxaparin (LOVENOX) injection 40 mg  40 mg Subcutaneous Q24H Lance Coon, MD   40 mg at 07/31/16 1000  . ondansetron (ZOFRAN) tablet 4 mg  4 mg Oral Q6H PRN Lance Coon, MD       Or  . ondansetron [facility]) injection 4 mg  4 mg Intravenous Q6H PRN Lance Coon, MD      . oxyCODONE-acetaminophen (PERCOCET/ROXICET) 5-325 MG per tablet 1 tablet  1 tablet Oral Q6H PRN Vaughan Basta, MD   1 tablet at 07/31/16 0707    Musculoskeletal: Strength & Muscle Tone: within normal limits Gait & Station: unable to stand Patient leans: N/A  Psychiatric Specialty Exam: Physical Exam  Nursing note and vitals reviewed. Constitutional: He appears well-developed and well-nourished.  HENT:  Head: Normocephalic and atraumatic.  Eyes: Conjunctivae are normal. Pupils are equal, round, and reactive to light.  Neck: Normal range of motion.  Cardiovascular: Normal heart sounds.   Respiratory: Effort normal.      GI: Soft.  Musculoskeletal: Normal range of motion.  Neurological: He is alert.  Skin: Skin is warm and dry.  Psychiatric: His affect is blunt. His speech is delayed. He is slowed. He expresses impulsivity. He expresses suicidal ideation. He exhibits abnormal recent memory.    Review of Systems  Constitutional: Negative.   HENT: Negative.   Eyes: Negative.   Respiratory: Negative.   Cardiovascular:  Negative.   Gastrointestinal: Negative.   Musculoskeletal: Negative.   Skin: Negative.   Neurological: Negative.   Psychiatric/Behavioral: Positive for depression, hallucinations, memory loss, substance abuse and suicidal ideas. The patient is nervous/anxious and has insomnia.     Blood pressure 103/76, pulse 75, temperature 97.6 F (36.4 C), temperature source Oral, resp. rate 18, height 5' 9"  (1.753 m), weight 59 kg (130 lb), SpO2 98 %.Body mass index is 19.2  kg/m.  General Appearance: Casual  Eye Contact:  Minimal  Speech:  Slow  Volume:  Decreased  Mood:  Depressed  Affect:  Congruent  Thought Process:  Goal Directed  Orientation:  Full (Time, Place, and Person)  Thought Content:  Logical  Suicidal Thoughts:  Yes.  with intent/plan  Homicidal Thoughts:  No  Memory:  Immediate;   Fair Recent;   Fair Remote;   Fair  Judgement:  Fair  Insight:  Fair  Psychomotor Activity:  Decreased  Concentration:  Concentration: Fair  Recall:  [employer] of Knowledge:  Fair  Language:  Fair  Akathisia:  No  Handed:  Right  AIMS (if indicated):     Assets:  Desire for Improvement Housing Social Support  ADL's:  Impaired  Cognition:  Impaired,  Mild  Sleep:        Treatment Plan Summary: Daily contact with patient to assess and evaluate symptoms and progress in treatment, Medication management and Plan Patient made a serious suicide attempt continues to endorse depressed mood suicidality and hopelessness. Talks about feeling like he is a burden to his family and on everyone around him and that everyone would be better off if he were dead. Affect sad and negative. I suggest continuing the commitment and the sitter at least 1 more day. Possibly more in order to get him to the psychiatric ward. At this point I would support transfer to psychiatry once he is medically stable in the chest tube was removed. Supportive counseling and review of plan with patient. I'm not starting any antidepressant medicine at this point while he is still medically little unstable.  Patient's diagnosis amended to include single episode major depression without psychotic features.  Disposition: Recommend psychiatric Inpatient admission when medically cleared.  Alethia Berthold, MD 07/31/2016 12:56 PM

## 2016-07-30 NOTE — ED Notes (Signed)
Surgeon at bedside.  

## 2016-07-30 NOTE — ED Notes (Signed)
Patient states he ingested 20-30 tizanidine.  Patient asked if he took the medication because he wanted to hurt or kill himself and he responded "yea".

## 2016-07-30 NOTE — ED Notes (Signed)
Pt given Toradol as ordered for pain; rates 10/10; sitter remains at bedside; will continue to monitor pt

## 2016-07-30 NOTE — ED Notes (Signed)
Pt's last blood pressure was 77/49; Dr Sheryle Hailiamond notified; order for another fluid bolus; pt now to stay in department until stabilized; transfer to floor bed when appropriate; pt understands; sitter remains at bedside

## 2016-07-30 NOTE — Consult Note (Signed)
Date of Consultation:  07/30/2016  Requesting Physician:  Jene Every, MD  Reason for Consultation:  Right pneumothorax  History of Present Illness: Richard Harrington is a 46 y.o. male who presents with a drug overdose.  Patient took in Zanaflex muscle relaxer as well as methamphetamine.  He came in lethargic and had some mild hypotension and bradycardia.  During his workup, he had a CXR which showed a moderate size right pneumothorax.  The patient denies any recent falls, though per RN report, his family did mention he had fallen recently.  He reports that he smokes and was feeling more short of breath about two weeks ago and stopped smoking, which has helped with his breathing.  Otherwise, unclear as to the timing of the pneumothorax.  Denies any fevers or chills, nausea, vomiting, abdominal pain.  Surgery was called about the patient and given the size of the pneumothorax and mild shortness of breath, recommendation was made for chest tube placement.  After placement, repeat CXR showed partial re-expansion of the right lung.  Past Medical History: Past Medical History:  Diagnosis Date  . Depression   . Inguinal hernia    left  . Substance abuse      Past Surgical History: Past Surgical History:  Procedure Laterality Date  . INCISION AND DRAINAGE ABSCESS     tonsil abscess    Home Medications: Prior to Admission medications   Medication Sig Start Date End Date Taking? Authorizing Provider  meloxicam (MOBIC) 7.5 MG tablet Take 7.5 mg by mouth 2 (two) times daily.   Yes [provider]  TIZANIDINE HCL PO Take by mouth.   Yes [provider]    Allergies: No Known Allergies  Social History:  reports that he has quit smoking. His smoking use included Cigarettes. He has a 12.00 pack-year smoking history. He has never used smokeless tobacco. He reports that he drinks alcohol. He reports that he uses drugs, including Methamphetamines.   Family History: Family  History  Problem Relation Age of Onset  . Colon cancer Neg Hx   . Breast cancer Neg Hx     Review of Systems: Review of Systems  Constitutional: Negative for chills and fever.  HENT: Negative for hearing loss.   Respiratory: Positive for shortness of breath.   Cardiovascular: Negative for chest pain.  Gastrointestinal: Negative for abdominal pain, nausea and vomiting.  Genitourinary: Negative for dysuria.  Musculoskeletal: Negative for back pain.  Skin: Negative for rash.  Psychiatric/Behavioral: Positive for depression and substance abuse.    Physical Exam BP (!) 73/60   Pulse 69   Temp 97.7 F (36.5 C) (Oral)   Resp 12   Ht 5\' 9"  (1.753 m)   Wt 59 kg (130 lb)   SpO2 98%   BMI 19.20 kg/m  CONSTITUTIONAL: No acute distress HEENT:  Normocephalic, atraumatic, extraocular motion intact. NECK: Trachea is midline, and there is no jugular venous distension.  RESPIRATORY:  Lungs are clear, and breath sounds are equal bilaterally. Right chest tube in place, to suction, without airleak. CARDIOVASCULAR: Heart is regular without murmurs, gallops, or rubs. GI: The abdomen is soft, nondistended, nontender.  MUSCULOSKELETAL:  Normal muscle strength and tone in all four extremities.  No peripheral edema or cyanosis. SKIN: Skin turgor is normal. There are no pathologic skin lesions.  NEUROLOGIC:  Motor and sensation is grossly normal.  Cranial nerves are grossly intact. PSYCH:  Alert and oriented to person, place and time. Affect is normal.  Laboratory Analysis: Results  for orders placed or performed during the hospital encounter of 07/29/16 (from the past 24 hour(s))  Comprehensive metabolic panel     Status: Abnormal   Collection Time: 07/29/16  9:37 PM  Result Value Ref Range   Sodium 131 (L) 135 - 145 mmol/L   Potassium 4.2 3.5 - 5.1 mmol/L   Chloride 100 (L) 101 - 111 mmol/L   CO2 22 22 - 32 mmol/L   Glucose, Bld 159 (H) 65 - 99 mg/dL   BUN 36 (H) 6 - 20 mg/dL   Creatinine,  Ser 1.611.10 0.61 - 1.24 mg/dL   Calcium 9.0 8.9 - 09.610.3 mg/dL   Total Protein 7.4 6.5 - 8.1 g/dL   Albumin 4.2 3.5 - 5.0 g/dL   AST 27 15 - 41 U/L   ALT 18 17 - 63 U/L   Alkaline Phosphatase 75 38 - 126 U/L   Total Bilirubin 1.4 (H) 0.3 - 1.2 mg/dL   GFR calc non Af Amer >60 >60 mL/min   GFR calc Af Amer >60 >60 mL/min   Anion gap 9 5 - 15  Ethanol     Status: None   Collection Time: 07/29/16  9:37 PM  Result Value Ref Range   Alcohol, Ethyl (B) <5 <5 mg/dL  Salicylate level     Status: None   Collection Time: 07/29/16  9:37 PM  Result Value Ref Range   Salicylate Lvl <7.0 2.8 - 30.0 mg/dL  Acetaminophen level     Status: Abnormal   Collection Time: 07/29/16  9:37 PM  Result Value Ref Range   Acetaminophen (Tylenol), Serum <10 (L) 10 - 30 ug/mL  cbc     Status: None   Collection Time: 07/29/16  9:37 PM  Result Value Ref Range   WBC 9.5 3.8 - 10.6 K/uL   RBC 5.13 4.40 - 5.90 MIL/uL   Hemoglobin 15.6 13.0 - 18.0 g/dL   HCT 04.544.5 40.940.0 - 81.152.0 %   MCV 86.8 80.0 - 100.0 fL   MCH 30.4 26.0 - 34.0 pg   MCHC 35.0 32.0 - 36.0 g/dL   RDW 91.413.0 78.211.5 - 95.614.5 %   Platelets 196 150 - 440 K/uL  Troponin I     Status: None   Collection Time: 07/29/16  9:37 PM  Result Value Ref Range   Troponin I <0.03 <0.03 ng/mL    Imaging: Dg Chest Portable 1 View  Result Date: 07/29/2016 CLINICAL DATA:  46 year old male with right-sided pneumothorax status post chest tube placement. EXAM: PORTABLE CHEST 1 VIEW COMPARISON:  Earlier radiograph dated 07/29/2016 FINDINGS: There has been interval placement of a right-sided chest tube with tip projecting over the right seventh intercostal space. There has been interval decrease in the size of the right-sided pneumothorax now measuring approximately 2 cm from the apical pleural (previously 4.7 cm). The lungs are clear. There is no pleural effusion. The cardiac silhouette is within normal limits. No acute osseous pathology. IMPRESSION: Interval placement of a right  chest tube with decrease in the size of the right apical pneumothorax. Continued follow-up recommended. Electronically Signed   By: Elgie CollardArash  Radparvar M.D.   On: 07/29/2016 23:21   Dg Chest Portable 1 View  Result Date: 07/29/2016 CLINICAL DATA:  46 year old male with overdose and unresponsiveness. EXAM: PORTABLE CHEST 1 VIEW COMPARISON:  None. FINDINGS: There is a right upper lobe pneumothorax measuring approximately 4.7 cm in craniocaudal length from the right apical pleura. The lungs are clear. There is no pleural effusion. The cardiac  silhouette is within normal limits. No acute osseous pathology. IMPRESSION: Right apical pneumothorax. These results were called by telephone at the time of interpretation on 07/29/2016 at 10:21 pm to Dr. Jene Every , who verbally acknowledged these results. Electronically Signed   By: Elgie Collard M.D.   On: 07/29/2016 22:23    Assessment and Plan: This is a 46 y.o. male who presents with drug overdose of Zanaflex and methamphetamine, with incidental right pneumothorax on CXR.  I have independently viewed the patient's imaging studies and reviewed the patient's laboratory studies.  CXR showed moderate right pneumothorax, with partial re-expansion after chest tube placement.  No pleural effusion noted.  Chest tube placed in ED by Dr. Cyril Loosen without complications.  Will continue the chest tube to suction.  Discussed with the patient that he will have a CXR in the morning to reassess the lung re-expansion.  The chest tube will stay in place until the lung is fully re-expanded and stays that way after placement to waterseal.  Patient will need appropriate pain control.  From the surgical standpoint, no further procedures planned at this point and patient may have a diet when deemed appropriate by primary team.  Will discuss with Dr. Thelma Barge with thoracic surgery for further management of the patient's chest tube.   Howie Ill, MD [facility]

## 2016-07-30 NOTE — ED Notes (Signed)
Spoke with Duwayne Heckanielle at poison control who called to check on pt and get lab results/vital signs

## 2016-07-30 NOTE — ED Notes (Signed)
Admission MD notified of patient's BP dropping.  Bolus ordered and administered. Patient placed back in trendelenburg position.

## 2016-07-30 NOTE — ED Notes (Signed)
Patient given meal tray.

## 2016-07-31 ENCOUNTER — Inpatient Hospital Stay: Payer: BLUE CROSS/BLUE SHIELD

## 2016-07-31 LAB — HIV ANTIBODY (ROUTINE TESTING W REFLEX): HIV SCREEN 4TH GENERATION: NONREACTIVE

## 2016-07-31 MED ORDER — DOCUSATE SODIUM 100 MG PO CAPS
100.0000 mg | ORAL_CAPSULE | Freq: Two times a day (BID) | ORAL | Status: DC
  Filled 2016-07-31 (×2): qty 1

## 2016-07-31 NOTE — Plan of Care (Signed)
Problem: Education: Goal: Knowledge of [place] [facility] Education information/materials will improve Outcome: Progressing Patient is pleasant. Patient knows the information.   Problem: Safety: Goal: Ability to remain free from injury will improve Outcome: Progressing Patient is free from injury.   Comments: Patient is pleasant. Patient is doing better. Patient has chest tube taken out today.

## 2016-07-31 NOTE — Progress Notes (Addendum)
[employer] - [place] at [facility]   PATIENT NAME: Richard Harrington    MR#:  [id number]  DATE OF BIRTH:  [birth year]  SUBJECTIVE:  CHIEF COMPLAINT:   Chief Complaint  Patient presents with  . Drug Overdose    Came after overdose of muscle relaxants. He also had SOB for atleast 2 weeks, was told by his PMD recently about COPD. Found to have pneumothorax and chest tube is placed by surgical team. Pt was dening any complains.  REVIEW OF SYSTEMS:  CONSTITUTIONAL: No fever, fatigue or weakness.  EYES: No blurred or double vision.  EARS, NOSE, AND THROAT: No tinnitus or ear pain.  RESPIRATORY: No cough, shortness of breath, wheezing or hemoptysis.  CARDIOVASCULAR: No chest pain, orthopnea, edema.  GASTROINTESTINAL: No nausea, vomiting, diarrhea or abdominal pain.  GENITOURINARY: No dysuria, hematuria.  ENDOCRINE: No polyuria, nocturia,  HEMATOLOGY: No anemia, easy bruising or bleeding SKIN: No rash or lesion. MUSCULOSKELETAL: No joint pain or arthritis.   NEUROLOGIC: No tingling, numbness, weakness.  PSYCHIATRY: No anxiety or depression.   ROS  DRUG ALLERGIES:  No Known Allergies  VITALS:  Blood pressure 103/76, pulse 75, temperature 97.6 F (36.4 C), temperature source Oral, resp. rate 18, height 5\' 9"  (1.753 m), weight 59 kg (130 lb), SpO2 98 %.  PHYSICAL EXAMINATION:  GENERAL:  46 y.o.-year-old thin patient lying in the bed with no acute distress.  EYES: Pupils equal, round, reactive to light and accommodation. No scleral icterus. Extraocular muscles intact.  HEENT: Head atraumatic, normocephalic. Oropharynx and nasopharynx clear.  NECK:  Supple, no jugular venous distention. No thyroid enlargement, no tenderness.  LUNGS: Normal breath sounds bilaterally, no wheezing, rales,rhonchi or crepitation. No use of accessory muscles of respiration. Right chest tube present. CARDIOVASCULAR: S1, S2 normal. No murmurs, rubs, or gallops.  ABDOMEN: Soft, nontender,  nondistended. Bowel sounds present. No organomegaly or mass.  EXTREMITIES: No pedal edema, cyanosis, or clubbing.  NEUROLOGIC: Cranial nerves II through XII are intact. Muscle strength 5/5 in all extremities. Sensation intact. Gait not checked.  PSYCHIATRIC: The patient is alert and oriented x 3. Appears depressed. SKIN: No obvious rash, lesion, or ulcer.   Physical Exam LABORATORY PANEL:   CBC  Recent Labs Lab 07/30/16 0854  WBC 7.4  HGB 14.0  HCT 40.8  PLT 169   ------------------------------------------------------------------------------------------------------------------  Chemistries   Recent Labs Lab 07/29/16 2137 07/30/16 0854  NA 131* 133*  K 4.2 4.1  CL 100* 107  CO2 22 22  GLUCOSE 159* 85  BUN 36* 26*  CREATININE 1.10 0.86  CALCIUM 9.0 8.4*  AST 27  --   ALT 18  --   ALKPHOS 75  --   BILITOT 1.4*  --    ------------------------------------------------------------------------------------------------------------------  Cardiac Enzymes  Recent Labs Lab 07/29/16 2137  TROPONINI <0.03   ------------------------------------------------------------------------------------------------------------------  RADIOLOGY:  Dg Chest Port 1 View  Result Date: 07/31/2016 CLINICAL DATA:  Recent placement of a small caliber chest tube on the left for pneumothorax which occurred 3 days ago. Former smoker, substance abuse. EXAM: PORTABLE CHEST 1 VIEW COMPARISON:  Portable chest x-ray of July 31, 2016 FINDINGS: There remains an approximately 10% right apical pneumothorax. The small caliber right chest is in stable position with the tip lying in the posterior seventh-eighth rib interspace. There is no significant pleural effusion. There is no mediastinal shift. The left lung is clear. The heart and pulmonary vascularity are normal. The bony thorax exhibits no acute abnormality. IMPRESSION: Persistent approximately 10% right apical  pneumothorax. Electronically Signed   By:  David  [place] M.D.   On: 07/31/2016 12:47   Dg Chest Port 1 View  Result Date: 07/31/2016 CLINICAL DATA:  Follow-up right pneumothorax EXAM: PORTABLE CHEST 1 VIEW COMPARISON:  07/30/2016 FINDINGS: Cardiac shadow is stable. The lungs are well aerated bilaterally. A tiny stable residual pneumothorax is noted in the right lung apex. No new focal abnormality is noted. Small right-sided chest tube is again seen and stable. IMPRESSION: Stable right apical pneumothorax. Electronically Signed   By: Alcide Clever M.D.   On: 07/31/2016 07:45   Portable Chest 1 View  Result Date: 07/30/2016 CLINICAL DATA:  Drug overdose, right-sided pneumothorax. EXAM: PORTABLE CHEST 1 VIEW COMPARISON:  Portable chest x-ray of July 30, 2016 FINDINGS: At approximately 10% right apical pneumothorax persists. There is no pleural effusion. The chest tube tip projects over the posterior aspect of the eighth rib and is stable. The left lung is clear. There is no mediastinal shift. The heart and pulmonary vascularity are normal. IMPRESSION: There is a stable 10% right apical pneumothorax. The small caliber chest tube is also in stable position. Electronically Signed   By: David  [place]aziland M.D.   On: 07/30/2016 09:13   Dg Chest Port 1 View  Result Date: 07/30/2016 CLINICAL DATA:  Follow-up right-sided pneumothorax EXAM: PORTABLE CHEST 1 VIEW COMPARISON:  Chest x-ray of July 29, 2016 FINDINGS: There remains a right-sided pneumothorax with the pleural line in the apex lying between the posterior third and fourth ribs. The small caliber chest tube tip lies between the seventh and eighth ribs posteriorly. There is no pleural effusion. There is no mediastinal shift. The left lung is clear. The heart and mediastinal structures are normal. The bony thorax exhibits no acute abnormality. IMPRESSION: Stable approximately 10% right apical pneumothorax. The chest tube is in stable position as well. Electronically Signed   By: David  [place] M.D.   On:  07/30/2016 07:19   Dg Chest Portable 1 View  Result Date: 07/29/2016 CLINICAL DATA:  46 year old male with right-sided pneumothorax status post chest tube placement. EXAM: PORTABLE CHEST 1 VIEW COMPARISON:  Earlier radiograph dated 07/29/2016 FINDINGS: There has been interval placement of a right-sided chest tube with tip projecting over the right seventh intercostal space. There has been interval decrease in the size of the right-sided pneumothorax now measuring approximately 2 cm from the apical pleural (previously 4.7 cm). The lungs are clear. There is no pleural effusion. The cardiac silhouette is within normal limits. No acute osseous pathology. IMPRESSION: Interval placement of a right chest tube with decrease in the size of the right apical pneumothorax. Continued follow-up recommended. Electronically Signed   By: Elgie Collard M.D.   On: 07/29/2016 23:21   Dg Chest Portable 1 View  Result Date: 07/29/2016 CLINICAL DATA:  46 year old male with overdose and unresponsiveness. EXAM: PORTABLE CHEST 1 VIEW COMPARISON:  None. FINDINGS: There is a right upper lobe pneumothorax measuring approximately 4.7 cm in craniocaudal length from the right apical pleura. The lungs are clear. There is no pleural effusion. The cardiac silhouette is within normal limits. No acute osseous pathology. IMPRESSION: Right apical pneumothorax. These results were called by telephone at the time of interpretation on 07/29/2016 at 10:21 pm to Dr. Jene Every , who verbally acknowledged these results. Electronically Signed   By: Elgie Collard M.D.   On: 07/29/2016 22:23    ASSESSMENT AND PLAN:   Principal Problem:   Drug overdose Active Problems:   Substance abuse  Suicide attempt (HCC)   Pneumothorax, right   Amphetamine abuse   Severe major depression, single episode, without psychotic features (HCC)  * Drug overdose - suicidal- methamphatemine and zanaflex  No need for acute intervention for his overdose,  other than supportive care while the drugs wean out of  his system.   Stable now.  *  Substance abuse - patient will need coordination for some sort of long-term treatment for his substance abuse  *  Suicide attempt [facility](HCC) - psychiatry consult appreciated, tx to psych, once medically stable.  *  Pneumothorax, right - chest tube in place, follow with thoracic surgeon.   As per patient and nurse surgical doctor saw him and do repeat xray in afternoon, and possibly remove   chest tube.   All the records are reviewed and case discussed with Care Management/Social Workerr. Management plans discussed with the patient, family and they are in agreement.  CODE STATUS: full.  TOTAL TIME TAKING CARE OF THIS PATIENT: 35 minutes.  Pt's family was present in room during my visit.   POSSIBLE D/C IN 1-2 DAYS, DEPENDING ON CLINICAL CONDITION. Likely d/c tomorrow to psych floor if chest tube was removed today.  Altamese DillingVACHHANI, Shelisa Fern M.D on 07/31/2016   Between 7am to 6pm - Pager - [phone]  After 6pm go to [email or web address] - password [email or web address]  [facility]  Office  [phone]  CC: Primary care physician; Evelene CroonNiemeyer, [place], MD  Note: This dictation was prepared with Dragon dictation along with smaller phrase technology. Any transcriptional errors that result from this process are unintentional.

## 2016-07-31 NOTE — Consult Note (Signed)
[place] Psychiatry Consult   Reason for Consult:  Follow-up consult for 46 year old man with a history of substance abuse who came in after a suicide attempt Referring Physician:  Sudini Patient Identification: Richard Harrington MRN:  [id number] Principal Diagnosis: Severe major depression, single episode, without psychotic features [facility]) Diagnosis:   Patient Active Problem List   Diagnosis Date Noted  . Severe major depression, single episode, without psychotic features ([place]) [F32.2] 07/31/2016  . Amphetamine abuse [F15.10] 07/30/2016  . Drug overdose [T50.901A] 07/29/2016  . Substance abuse [F19.10] 07/29/2016  . Suicide attempt ([place]) [T14.91XA] 07/29/2016  . Pneumothorax on right [J93.9] 07/29/2016  . Inguinal hernia [K40.90]     Total Time spent with patient: 20 minutes  Subjective:   JULIOCESAR Harrington is a 46 y.o. male patient admitted with "I'm not feeling good".  HPI:  Follow-up note for this 46 year old man came into the hospital after overdosing and making a serious suicide attempt. Patient was hospitalized longer than expected because of a discovery of a pneumothorax that required temporary chest tube. Today the chest tube has been removed and he appears to be medically stabilized. Patient continues to endorse depression and suicidal ideation.  Past Psychiatric History: History of opiate abuse recent relapse history of depression and suicide attempt  Risk to Self: Is patient at risk for suicide?: Yes Risk to Others:   Prior Inpatient Therapy:   Prior Outpatient Therapy:    Past Medical History:  Past Medical History:  Diagnosis Date  . Depression   . Inguinal hernia    left  . Substance abuse     Past Surgical History:  Procedure Laterality Date  . INCISION AND DRAINAGE ABSCESS     tonsil abscess   Family History:  Family History  Problem Relation Age of Onset  . Colon cancer Neg Hx   . Breast cancer Neg Hx    Family Psychiatric  History:  Unknown Social History:  History  Alcohol Use  . Yes    Comment: occasional     History  Drug Use  . Types: Methamphetamines    Social History   Social History  . Marital status: Single    Spouse name: N/A  . Number of children: N/A  . Years of education: N/A   Social History Main Topics  . Smoking status: Former Smoker    Packs/day: 0.50    Years: 24.00    Types: Cigarettes  . Smokeless tobacco: Never Used  . Alcohol use Yes     Comment: occasional  . Drug use: Yes    Types: Methamphetamines  . Sexual activity: Not Asked   Other Topics Concern  . None   Social History Narrative  . None   Additional Social History:    Allergies:  No Known Allergies  Labs:  Results for orders placed or performed during the hospital encounter of 07/29/16 (from the past 48 hour(s))  Comprehensive metabolic panel     Status: Abnormal   Collection Time: 07/29/16  9:37 PM  Result Value Ref Range   Sodium 131 (L) 135 - 145 mmol/L   Potassium 4.2 3.5 - 5.1 mmol/L    Comment: HEMOLYSIS AT THIS LEVEL MAY AFFECT RESULT   Chloride 100 (L) 101 - 111 mmol/L   CO2 22 22 - 32 mmol/L   Glucose, Bld 159 (H) 65 - 99 mg/dL   BUN 36 (H) 6 - 20 mg/dL   Creatinine, Ser 1.10 0.61 - 1.24 mg/dL   Calcium 9.0 8.9 -  10.3 mg/dL   Total Protein 7.4 6.5 - 8.1 g/dL   Albumin 4.2 3.5 - 5.0 g/dL   AST 27 15 - 41 U/L   ALT 18 17 - 63 U/L   Alkaline Phosphatase 75 38 - 126 U/L   Total Bilirubin 1.4 (H) 0.3 - 1.2 mg/dL   GFR calc non Af Amer >60 >60 mL/min   GFR calc Af Amer >60 >60 mL/min    Comment: (NOTE) The eGFR has been calculated using the CKD EPI equation. This calculation has not been validated in all clinical situations. eGFR's persistently <60 mL/min signify possible Chronic Kidney Disease.    Anion gap 9 5 - 15  Ethanol     Status: None   Collection Time: 07/29/16  9:37 PM  Result Value Ref Range   Alcohol, Ethyl (B) <5 <5 mg/dL    Comment:        LOWEST DETECTABLE LIMIT FOR SERUM  ALCOHOL IS 5 mg/dL FOR MEDICAL PURPOSES ONLY   Salicylate level     Status: None   Collection Time: 07/29/16  9:37 PM  Result Value Ref Range   Salicylate Lvl <8.8 2.8 - 30.0 mg/dL  Acetaminophen level     Status: Abnormal   Collection Time: 07/29/16  9:37 PM  Result Value Ref Range   Acetaminophen (Tylenol), Serum <10 (L) 10 - 30 ug/mL    Comment:        THERAPEUTIC CONCENTRATIONS VARY SIGNIFICANTLY. A RANGE OF 10-30 ug/mL MAY BE AN EFFECTIVE CONCENTRATION FOR MANY PATIENTS. HOWEVER, SOME ARE BEST TREATED AT CONCENTRATIONS OUTSIDE THIS RANGE. ACETAMINOPHEN CONCENTRATIONS >150 ug/mL AT 4 HOURS AFTER INGESTION AND >50 ug/mL AT 12 HOURS AFTER INGESTION ARE OFTEN ASSOCIATED WITH TOXIC REACTIONS.   cbc     Status: None   Collection Time: 07/29/16  9:37 PM  Result Value Ref Range   WBC 9.5 3.8 - 10.6 K/uL   RBC 5.13 4.40 - 5.90 MIL/uL   Hemoglobin 15.6 13.0 - 18.0 g/dL   HCT 44.5 40.0 - 52.0 %   MCV 86.8 80.0 - 100.0 fL   MCH 30.4 26.0 - 34.0 pg   MCHC 35.0 32.0 - 36.0 g/dL   RDW 13.0 11.5 - 14.5 %   Platelets 196 150 - 440 K/uL  Troponin I     Status: None   Collection Time: 07/29/16  9:37 PM  Result Value Ref Range   Troponin I <0.03 <0.03 ng/mL  HIV antibody (Routine Testing)     Status: None   Collection Time: 07/30/16  8:54 AM  Result Value Ref Range   HIV Screen 4th Generation wRfx Non Reactive Non Reactive    Comment: (NOTE) Performed At: [facility] [place], [place] [id number] Lindon Romp MD BR:[id number]   CBC     Status: None   Collection Time: 07/30/16  8:54 AM  Result Value Ref Range   WBC 7.4 3.8 - 10.6 K/uL   RBC 4.70 4.40 - 5.90 MIL/uL   Hemoglobin 14.0 13.0 - 18.0 g/dL   HCT 40.8 40.0 - 52.0 %   MCV 86.9 80.0 - 100.0 fL   MCH 29.9 26.0 - 34.0 pg   MCHC 34.4 32.0 - 36.0 g/dL   RDW 13.2 11.5 - 14.5 %   Platelets 169 150 - 440 K/uL  Basic metabolic panel     Status: Abnormal   Collection Time: 07/30/16  8:54 AM   Result Value Ref Range   Sodium 133 (L) 135 -  145 mmol/L   Potassium 4.1 3.5 - 5.1 mmol/L   Chloride 107 101 - 111 mmol/L   CO2 22 22 - 32 mmol/L   Glucose, Bld 85 65 - 99 mg/dL   BUN 26 (H) 6 - 20 mg/dL   Creatinine, Ser 0.86 0.61 - 1.24 mg/dL   Calcium 8.4 (L) 8.9 - 10.3 mg/dL   GFR calc non Af Amer >60 >60 mL/min   GFR calc Af Amer >60 >60 mL/min    Comment: (NOTE) The eGFR has been calculated using the CKD EPI equation. This calculation has not been validated in all clinical situations. eGFR's persistently <60 mL/min signify possible Chronic Kidney Disease.    Anion gap 4 (L) 5 - 15    Current Facility-Administered Medications  Medication Dose Route Frequency Provider Last Rate Last Dose  . 0.9 %  sodium chloride infusion   Intravenous Continuous Lance Coon, MD 75 mL/hr at 07/31/16 1849    . docusate sodium (COLACE) capsule 100 mg  100 mg Oral BID Vaughan Basta, MD      . enoxaparin (LOVENOX) injection 40 mg  40 mg Subcutaneous Q24H Lance Coon, MD   40 mg at 07/31/16 1000  . ondansetron (ZOFRAN) tablet 4 mg  4 mg Oral Q6H PRN Lance Coon, MD       Or  . ondansetron [facility]) injection 4 mg  4 mg Intravenous Q6H PRN Lance Coon, MD      . oxyCODONE-acetaminophen (PERCOCET/ROXICET) 5-325 MG per tablet 1 tablet  1 tablet Oral Q6H PRN Vaughan Basta, MD   1 tablet at 07/31/16 1309    Musculoskeletal: Strength & Muscle Tone: within normal limits Gait & Station: normal Patient leans: N/A  Psychiatric Specialty Exam: Physical Exam  Nursing note and vitals reviewed. Constitutional: He appears well-developed and well-nourished.  HENT:  Head: Normocephalic and atraumatic.  Eyes: Conjunctivae are normal. Pupils are equal, round, and reactive to light.  Neck: Normal range of motion.  Cardiovascular: Normal heart sounds.   Respiratory: Effort normal.  GI: Soft.  Musculoskeletal: Normal range of motion.  Neurological: He is alert.  Skin: Skin is warm  and dry.  Psychiatric: His speech is delayed. He is slowed. Cognition and memory are impaired. He exhibits a depressed mood. He expresses suicidal ideation.    Review of Systems  Constitutional: Negative.   HENT: Negative.   Eyes: Negative.   Respiratory: Negative.   Cardiovascular: Negative.   Gastrointestinal: Negative.   Musculoskeletal: Negative.   Skin: Negative.   Neurological: Negative.   Psychiatric/Behavioral: Positive for depression, substance abuse and suicidal ideas. Negative for hallucinations. The patient has insomnia. The patient is not nervous/anxious.     Blood pressure 103/76, pulse 75, temperature 97.6 F (36.4 C), temperature source Oral, resp. rate 18, height _0  (1.753 m), weight 59 kg (130 lb), SpO2 98 %.Body mass index is 19.2 kg/m.  General Appearance: Casual  Eye Contact:  Minimal  Speech:  Slow  Volume:  Decreased  Mood:  Depressed  Affect:  Depressed  Thought Process:  Goal Directed  Orientation:  Full (Time, Place, and Person)  Thought Content:  Rumination  Suicidal Thoughts:  Yes.  with intent/plan  Homicidal Thoughts:  No  Memory:  Immediate;   Fair Recent;   Fair Remote;   Fair  Judgement:  Fair  Insight:  Fair  Psychomotor Activity:  Decreased  Concentration:  Concentration: Fair  Recall:  [employer] of Knowledge:  Fair  Language:  Fair  Akathisia:  No  Handed:  Right  AIMS (if indicated):     Assets:  Desire for Improvement Physical Health Resilience  ADL's:  Intact  Cognition:  WNL  Sleep:        Treatment Plan Summary: Daily contact with patient to assess and evaluate symptoms and progress in treatment, Medication management and Plan See note note below  Disposition: Daily contact with patient to assess and evaluate symptoms and progress in treatment, Medication management and Plan Patient has had his chest tube removed and is now medically stable and would be appropriate for admission to the psychiatric unit. Patient made  serious suicide attempt continues to endorse symptoms of depression, negative thoughts, poor sleep low energy and passive suicidal thoughts. Continue involuntary commitment. Case reviewed with TTS. Recommend transfer to the psychiatric ward tomorrow morning or as soon as possible.  Alethia Berthold, MD 07/31/2016 7:10 PM

## 2016-07-31 NOTE — Progress Notes (Signed)
  Patient ID: Richard BailMichael S Harrington, male   DOB: [birth year], 46 y.o.   MRN: [id number]  HISTORY: Complains of some chest tube discomfort.  Not short of breath.     Vitals:   07/31/16 0500 07/31/16 0817  BP: 101/65 103/76  Pulse: 75 75  Resp: 19 18  Temp: 97.6 F (36.4 C) 97.6 F (36.4 C)     EXAM:    Resp: Lungs are clear bilaterally.  No respiratory distress, normal effort. Heart:  Regular without murmurs Neurological: Alert and oriented to person, place, and time. Coordination normal.  Skin: Skin is warm and dry. No rash noted. No diaphoretic. No erythema. No pallor.  Psychiatric: Normal mood and affect. Normal behavior. Judgment and thought content normal.   No air leak seen   ASSESSMENT: I have independently reviewed and compared his chest xrays from today.  There is no change between the two, one on suction and one on water seal.   Spontaneous pneumothorax  PLAN:   Chest tube removed without incident.  No CXRay needed.  Please arrange for followup with me in one week post discharge.       Hulda Marinimothy Sheliah Fiorillo, MD

## 2016-07-31 NOTE — Progress Notes (Signed)
   07/31/16 1040  Clinical Encounter Type  Visited With Patient;Health care provider  Visit Type Initial  Referral From Other (Comment) (Rounding )   Chaplain stopped by patients room during morning rounding. Patient was in room with Nurse. Introduced myself and spoke briefly. Patient will contact if he needs services.

## 2016-08-01 ENCOUNTER — Inpatient Hospital Stay
Admission: EM | Admit: 2016-08-01 | Discharge: 2016-08-06 | DRG: 885 | Disposition: A | Discharge status: Home / Self Care | Payer: BLUE CROSS/BLUE SHIELD | Source: Intra-hospital | Attending: Psychiatry | Admitting: Psychiatry

## 2016-08-01 DIAGNOSIS — F1421 Cocaine dependence, in remission: Secondary | ICD-10-CM | POA: Diagnosis present

## 2016-08-01 DIAGNOSIS — F1520 Other stimulant dependence, uncomplicated: Secondary | ICD-10-CM

## 2016-08-01 DIAGNOSIS — Z87891 Personal history of nicotine dependence: Secondary | ICD-10-CM | POA: Diagnosis not present

## 2016-08-01 DIAGNOSIS — G4700 Insomnia, unspecified: Secondary | ICD-10-CM | POA: Diagnosis present

## 2016-08-01 DIAGNOSIS — F1521 Other stimulant dependence, in remission: Secondary | ICD-10-CM

## 2016-08-01 DIAGNOSIS — Z915 Personal history of self-harm: Secondary | ICD-10-CM | POA: Diagnosis not present

## 2016-08-01 DIAGNOSIS — Z791 Long term (current) use of non-steroidal anti-inflammatories (NSAID): Secondary | ICD-10-CM | POA: Diagnosis not present

## 2016-08-01 DIAGNOSIS — Z79899 Other long term (current) drug therapy: Secondary | ICD-10-CM | POA: Diagnosis not present

## 2016-08-01 DIAGNOSIS — F332 Major depressive disorder, recurrent severe without psychotic features: Secondary | ICD-10-CM | POA: Diagnosis present

## 2016-08-01 DIAGNOSIS — T50902A Poisoning by unspecified drugs, medicaments and biological substances, intentional self-harm, initial encounter: Secondary | ICD-10-CM | POA: Diagnosis not present

## 2016-08-01 DIAGNOSIS — J449 Chronic obstructive pulmonary disease, unspecified: Secondary | ICD-10-CM | POA: Diagnosis present

## 2016-08-01 HISTORY — DX: Hemothorax: J94.2

## 2016-08-01 LAB — CBC
HCT: 37.1 % — ABNORMAL LOW (ref 40.0–52.0)
Hemoglobin: 12.9 g/dL — ABNORMAL LOW (ref 13.0–18.0)
MCH: 29.9 pg (ref 26.0–34.0)
MCHC: 34.6 g/dL (ref 32.0–36.0)
MCV: 86.3 fL (ref 80.0–100.0)
PLATELETS: 139 10*3/uL — AB (ref 150–440)
RBC: 4.3 MIL/uL — AB (ref 4.40–5.90)
RDW: 13.1 % (ref 11.5–14.5)
WBC: 5.3 10*3/uL (ref 3.8–10.6)

## 2016-08-01 LAB — BASIC METABOLIC PANEL
ANION GAP: 3 — AB (ref 5–15)
BUN: 10 mg/dL (ref 6–20)
CALCIUM: 8.5 mg/dL — AB (ref 8.9–10.3)
CO2: 29 mmol/L (ref 22–32)
Chloride: 106 mmol/L (ref 101–111)
Creatinine, Ser: 0.63 mg/dL (ref 0.61–1.24)
GFR calc Af Amer: >60 mL/min (ref >60)
Glucose, Bld: 89 mg/dL (ref 65–99)
POTASSIUM: 3.9 mmol/L (ref 3.5–5.1)
Sodium: 138 mmol/L (ref 135–145)

## 2016-08-01 MED ORDER — FLUOXETINE HCL 20 MG PO CAPS
20.0000 mg | ORAL_CAPSULE | Freq: Every day | ORAL | Status: DC
  Administered 2016-08-01: 20 mg via ORAL
  Filled 2016-08-01: qty 1

## 2016-08-01 MED ORDER — IBUPROFEN 600 MG PO TABS: 600.0000 mg | ORAL_TABLET | Freq: Four times a day (QID) | ORAL | Status: DC | PRN

## 2016-08-01 MED ORDER — TRAZODONE HCL 100 MG PO TABS: 100.0000 mg | ORAL_TABLET | Freq: Every evening | ORAL | Status: DC | PRN

## 2016-08-01 MED ORDER — ACETAMINOPHEN 325 MG PO TABS: 650.0000 mg | ORAL_TABLET | Freq: Four times a day (QID) | ORAL | Status: DC | PRN

## 2016-08-01 MED ORDER — FLUOXETINE HCL 20 MG PO CAPS
20.0000 mg | ORAL_CAPSULE | Freq: Every day | ORAL | Status: DC
  Administered 2016-08-02 – 2016-08-06 (×5): 20 mg via ORAL
  Filled 2016-08-01 (×5): qty 1

## 2016-08-01 MED ORDER — FLUOXETINE HCL 20 MG PO CAPS: 20.0000 mg | ORAL_CAPSULE | Freq: Every day | ORAL | 3 refills | Status: DC

## 2016-08-01 MED ORDER — HYDROXYZINE HCL 25 MG PO TABS: 25.0000 mg | ORAL_TABLET | Freq: Three times a day (TID) | ORAL | Status: DC | PRN

## 2016-08-01 MED ORDER — ALUM & MAG HYDROXIDE-SIMETH 200-200-20 MG/5ML PO SUSP: 30.0000 mL | ORAL | Status: DC | PRN

## 2016-08-01 MED ORDER — MAGNESIUM HYDROXIDE 400 MG/5ML PO SUSP: 30.0000 mL | Freq: Every day | ORAL | Status: DC | PRN

## 2016-08-01 NOTE — BH Assessment (Signed)
[facility] Dr. Toni Amendlapacs.  Attending Physician will be Dr. Ardyth HarpsHernandez.   Patient [facility] room 311[facility] Marylu LundJanet.   ER staff is aware of the admission Randa Evens( Simone, Patient's Nurse & Ivin BootyJoshua Patient Access). RN Randa Evens(Simone) states she will inform pt's physician (Dr.Sudini)  Pt may transfer after 1930.  Support paperwork needed.

## 2016-08-01 NOTE — Progress Notes (Signed)
1st psychiatric admit for this 46 yo DWM here on a voluntary status, S/P overdose on zanaflex while using Meth.  Pt reports an increase in depression after a relapse in using Meth after 8 months sobriety.  Pt reports he has a long hx of Meth use and abuse.  Pt reports he would like to receive substance abuse tx after his stay here.  Pt was found to have a pneumothorax upon admission to ED.  Pt had a chest tube and this was removed yesterday.  Pt has a dressing on this and it is CD+I.  Pt reports only minimal pain.  Pt was checked for contraband and had a full skin assessment with 2 RN's.  Pt has 3 tattoos and his wound for the chest tube.  Pt denies current S/I, H/I or hallucinations.  Pt verbally contracts for safety.  Oriented to unit.  Pt then retired to bed.

## 2016-08-01 NOTE — Discharge Summary (Signed)
[employer] [facility] - [place] at [facility]   PATIENT NAME: Richard Harrington    MR#:  [id number]  DATE OF BIRTH:  [birth year]  DATE OF ADMISSION:  07/29/2016 ADMITTING PHYSICIAN: Oralia Manisavid Willis, MD  DATE OF DISCHARGE: No discharge date for patient encounter.  PRIMARY CARE PHYSICIAN: Evelene CroonNiemeyer, Meindert, MD   ADMISSION DIAGNOSIS:  Pneumothorax on right [J93.9] Intentional drug overdose, initial encounter (HCC) [T50.902A] Pneumothorax, unspecified type [J93.9] Drug overdose [T50.901A]  DISCHARGE DIAGNOSIS:  Principal Problem:   Severe major depression, single episode, without psychotic features (HCC) Active Problems:   Drug overdose   Substance abuse   Suicide attempt (HCC)   Pneumothorax on right   Amphetamine abuse   SECONDARY DIAGNOSIS:   Past Medical History:  Diagnosis Date  . Depression   . Inguinal hernia    left  . Substance abuse      ADMITTING HISTORY  HISTORY OF PRESENT ILLNESS:  Richard Harrington  is a 46 y.o. male who presents with Drug overdose. 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 He came in lethargic, and was found in the ED to have a pneumothorax. He was placed under IVC, chest tube was placed, and hospitalists were called for admission.   HOSPITAL COURSE:   * Drug overdose - suicidal- methamphatemine and zanaflex  Monitored on Telemetry. No complications.   Stable now.  *Substance abuse Counseled this admission  *Suicide attempt with severe depression. Discussed with Dr. Toni Amendlapacs of psychiatry. Patient will be transferred to behavioral health unit. Stable for transfer  *Pneumothorax, right - chest tube placed and now removed. No SOB/CP F/U with Dr. Thelma Bargeaks as OP  CONSULTS OBTAINED:  Treatment Team:   Henrene DodgePiscoya, Jose, MD Clapacs, Jackquline DenmarkJohn T, MD  DRUG ALLERGIES:  No Known Allergies  DISCHARGE MEDICATIONS:   Current Discharge Medication List    START taking these medications   Details  FLUoxetine (PROZAC) 20 MG capsule Take 1 capsule (20 mg total) by mouth daily. Refills: 3      CONTINUE these medications which have NOT CHANGED   Details  meloxicam (MOBIC) 7.5 MG tablet Take 7.5 mg by mouth 2 (two) times daily.      STOP taking these medications     TIZANIDINE HCL PO         Today   VITAL SIGNS:  Blood pressure 105/69, pulse 70, temperature 97.8 F (36.6 C), temperature source Oral, resp. rate 17, height 5\' 9"  (1.753 m), weight 59 kg (130 lb), SpO2 97 %.  I/O:   Intake/Output Summary (Last 24 hours) at 08/01/16 2038 Last data filed at 08/01/16 1900  Gross per 24 hour  Intake           1122.5 ml  Output                0 ml  Net           1122.5 ml    PHYSICAL EXAMINATION:  Physical Exam  GENERAL:  46 y.o.-year-old patient lying in the bed with no acute distress.  LUNGS: Normal breath sounds bilaterally, no wheezing, rales,rhonchi or crepitation. No use of accessory muscles of respiration.  CARDIOVASCULAR: S1, S2 normal. No murmurs, rubs, or gallops.  ABDOMEN: Soft, non-tender, non-distended. Bowel sounds present. No organomegaly or mass.  NEUROLOGIC:  Moves all 4 extremities. PSYCHIATRIC: The patient is alert and oriented x 3.  SKIN: No obvious rash, lesion, or ulcer.   DATA REVIEW:   CBC  Recent Labs Lab 08/01/16 0629  WBC 5.3  HGB 12.9*  HCT 37.1*  PLT 139*    Chemistries   Recent Labs Lab 07/29/16 2137  08/01/16 0629  NA 131*  < > 138  K 4.2  < > 3.9  CL 100*  < > 106  CO2 22  < > 29  GLUCOSE 159*  < > 89  BUN 36*  < > 10  CREATININE 1.10  < > 0.63  CALCIUM 9.0  < > 8.5*  AST 27  --   --   ALT 18  --   --   ALKPHOS 75  --   --   BILITOT 1.4*  --   --   < > = values in this interval not displayed.  Cardiac Enzymes  Recent  Labs Lab 07/29/16 2137  TROPONINI <0.03    Microbiology Results  Results for orders placed or performed during the hospital encounter of 11/17/08  Culture, routine-abscess     Status: None   Collection Time: 11/17/08  7:10 PM  Result Value Ref Range Status   Specimen Description ABSCESS RIGHT NECK  Final   Special Requests PT ON KEFLEX,CLINDAMYCIN  Final   Gram Stain   Final    MODERATE WBC PRESENT, PREDOMINANTLY PMN NO SQUAMOUS EPITHELIAL CELLS SEEN NO ORGANISMS SEEN   Culture NO GROWTH 3 DAYS  Final   Report Status 11/21/2008 FINAL  Final  Anaerobic culture     Status: None   Collection Time: 11/17/08  7:10 PM  Result Value Ref Range Status   Specimen Description ABSCESS RIGHT NECK  Final   Special Requests PT ON KEFLEX,CLINDAMYCIN  Final   Gram Stain   Final    MODERATE WBC PRESENT, PREDOMINANTLY PMN NO SQUAMOUS EPITHELIAL CELLS SEEN NO ORGANISMS SEEN   Culture NO ANAEROBES ISOLATED  Final   Report Status 11/22/2008 FINAL  Final    RADIOLOGY:  Dg Chest Port 1 View  Result Date: 07/31/2016 CLINICAL DATA:  Recent placement of a small caliber chest tube on the left for pneumothorax which occurred 3 days ago. Former smoker, substance abuse. EXAM: PORTABLE CHEST 1 VIEW COMPARISON:  Portable chest x-ray of July 31, 2016 FINDINGS: There remains an approximately 10% right apical pneumothorax. The small caliber right chest is in stable position with the tip lying in the posterior seventh-eighth rib interspace. There is no significant pleural effusion. There is no mediastinal shift. The left lung is clear. The heart and pulmonary vascularity are normal. The bony thorax exhibits no acute abnormality. IMPRESSION: Persistent approximately 10% right apical pneumothorax. Electronically Signed   By: David  [place] M.D.   On: 07/31/2016 12:47   Dg Chest Port 1 View  Result Date: 07/31/2016 CLINICAL DATA:  Follow-up right pneumothorax EXAM: PORTABLE CHEST 1 VIEW COMPARISON:  07/30/2016  FINDINGS: Cardiac shadow is stable. The lungs are well aerated bilaterally. A tiny stable residual pneumothorax is noted in the right lung apex. No new focal abnormality is noted. Small right-sided chest tube is again seen and stable. IMPRESSION: Stable right apical pneumothorax. Electronically Signed   By: Alcide Clever M.D.   On: 07/31/2016 07:45    Follow up with PCP in 1 week.  Management plans discussed with the patient, family and they are in agreement.  CODE STATUS:     Code  Status Orders        Start     Ordered   07/30/16 0841  Full code  Continuous     07/30/16 0840    Code Status History    Date Active Date Inactive Code Status Order ID Comments User Context   This patient has a current code status but no historical code status.      TOTAL TIME TAKING CARE OF THIS PATIENT ON DAY OF DISCHARGE: more than 30 minutes.   Milagros Loll R M.D on 08/01/2016 at 8:38 PM  Between 7am to 6pm - Pager - [phone]  After 6pm go to [email or web address] - password [email or web address]  [facility] [place] [facility]  Office  [phone]  CC: Primary care physician; Evelene Croon, MD  Note: This dictation was prepared with Dragon dictation along with smaller phrase technology. Any transcriptional errors that result from this process are unintentional.

## 2016-08-01 NOTE — Progress Notes (Signed)
Received call from TTS that there is bed available for patient, called Dr sudini,   discharge order received.

## 2016-08-01 NOTE — Tx Team (Signed)
Initial Treatment Plan 08/01/2016 10:55 PM Richard Harrington [id number]    PATIENT STRESSORS: Financial difficulties Health problems Substance abuse   PATIENT STRENGTHS: Ability for insight Active sense of humor Average or above average intelligence Capable of independent living Communication skills   PATIENT IDENTIFIED PROBLEMS:   Substance abuse-Meth  Depression  S/P suicide attempt  Anxiety             DISCHARGE CRITERIA:  Ability to meet basic life and health needs Improved stabilization in mood, thinking, and/or behavior Medical problems require only outpatient monitoring Motivation to continue treatment in a less acute level of care Verbal commitment to aftercare and medication compliance  PRELIMINARY DISCHARGE PLAN: Attend aftercare/continuing care group Attend 12-step recovery group Outpatient therapy Return to previous living arrangement  PATIENT/FAMILY INVOLVEMENT: This treatment plan has been presented to and reviewed with the patient, Richard Harrington, The patient and family have been given the opportunity to ask questions and make suggestions.  Trinda PascalAmy  Makinsley Schiavi, RN 08/01/2016, 10:55 PM

## 2016-08-01 NOTE — BH Assessment (Signed)
Pt chart under review by Charge RN (Janet) for [facility] BMU bed assignment. 

## 2016-08-01 NOTE — Progress Notes (Signed)
Patient discharged to behavioral health. Patient was escorted via security. No complaints of pain. Patient is in agreement with plan of care.

## 2016-08-01 NOTE — Progress Notes (Signed)
[employer] - [place] at [facility]   PATIENT NAME: Richard Harrington    MR#:  [id number]  DATE OF BIRTH:  October 19, 1970  SUBJECTIVE:  CHIEF COMPLAINT:   Chief Complaint  Patient presents with  . Drug Overdose   Admitted for overdose on muscle relaxants. Also developed pneumothorax. Chest tube placed and now removed. Today he has no chest pain or shortness of breath. Continues to have flat affect. Sitter at bedside. Waiting for bed and behavioral health unit.  REVIEW OF SYSTEMS:  CONSTITUTIONAL: No fever, fatigue or weakness.  EYES: No blurred or double vision.  EARS, NOSE, AND THROAT: No tinnitus or ear pain.  RESPIRATORY: No cough, shortness of breath, wheezing or hemoptysis.  CARDIOVASCULAR: No chest pain, orthopnea, edema.  GASTROINTESTINAL: No nausea, vomiting, diarrhea or abdominal pain.  GENITOURINARY: No dysuria, hematuria.  ENDOCRINE: No polyuria, nocturia,  HEMATOLOGY: No anemia, easy bruising or bleeding SKIN: No rash or lesion. MUSCULOSKELETAL: No joint pain or arthritis.   NEUROLOGIC: No tingling, numbness, weakness.  PSYCHIATRY: No anxiety or depression.   ROS  DRUG ALLERGIES:  No Known Allergies  VITALS:  Blood pressure 115/71, pulse 78, temperature 97.8 F (36.6 C), temperature source Oral, resp. rate 16, height 5\' 9"  (1.753 m), weight 59 kg (130 lb), SpO2 97 %.  PHYSICAL EXAMINATION:  GENERAL:  46 y.o.-year-old thin patient lying in the bed with no acute distress.  EYES: Pupils equal, round, reactive to light and accommodation. No scleral icterus. Extraocular muscles intact.  HEENT: Head atraumatic, normocephalic. Oropharynx and nasopharynx clear.  NECK:  Supple, no jugular venous distention. No thyroid enlargement, no tenderness.  LUNGS: Normal breath sounds bilaterally, no wheezing, rales,rhonchi or crepitation. No use of accessory muscles of respiration. Right chest tube present. CARDIOVASCULAR: S1, S2 normal. No murmurs, rubs, or gallops.   ABDOMEN: Soft, nontender, nondistended. Bowel sounds present. No organomegaly or mass.  EXTREMITIES: No pedal edema, cyanosis, or clubbing.  NEUROLOGIC: Cranial nerves II through XII are intact. Muscle strength 5/5 in all extremities. Sensation intact. Gait not checked.  PSYCHIATRIC: The patient is alert and oriented x 3. Appears depressed. SKIN: No obvious rash, lesion, or ulcer.   Physical Exam LABORATORY PANEL:   CBC  Recent Labs Lab 08/01/16 0629  WBC 5.3  HGB 12.9*  HCT 37.1*  PLT 139*   ------------------------------------------------------------------------------------------------------------------  Chemistries   Recent Labs Lab 07/29/16 2137  08/01/16 0629  NA 131*  < > 138  K 4.2  < > 3.9  CL 100*  < > 106  CO2 22  < > 29  GLUCOSE 159*  < > 89  BUN 36*  < > 10  CREATININE 1.10  < > 0.63  CALCIUM 9.0  < > 8.5*  AST 27  --   --   ALT 18  --   --   ALKPHOS 75  --   --   BILITOT 1.4*  --   --   < > = values in this interval not displayed. ------------------------------------------------------------------------------------------------------------------  Cardiac Enzymes  Recent Labs Lab 07/29/16 2137  TROPONINI <0.03   ------------------------------------------------------------------------------------------------------------------  RADIOLOGY:  Dg Chest Port 1 View  Result Date: 07/31/2016 CLINICAL DATA:  Recent placement of a small caliber chest tube on the left for pneumothorax which occurred 3 days ago. Former smoker, substance abuse. EXAM: PORTABLE CHEST 1 VIEW COMPARISON:  Portable chest x-ray of July 31, 2016 FINDINGS: There remains an approximately 10% right apical pneumothorax. The small caliber right chest is in stable position with  the tip lying in the posterior seventh-eighth rib interspace. There is no significant pleural effusion. There is no mediastinal shift. The left lung is clear. The heart and pulmonary vascularity are normal. The bony  thorax exhibits no acute abnormality. IMPRESSION: Persistent approximately 10% right apical pneumothorax. Electronically Signed   By: David  SwazilandJordan M.D.   On: 07/31/2016 12:47   Dg Chest Port 1 View  Result Date: 07/31/2016 CLINICAL DATA:  Follow-up right pneumothorax EXAM: PORTABLE CHEST 1 VIEW COMPARISON:  07/30/2016 FINDINGS: Cardiac shadow is stable. The lungs are well aerated bilaterally. A tiny stable residual pneumothorax is noted in the right lung apex. No new focal abnormality is noted. Small right-sided chest tube is again seen and stable. IMPRESSION: Stable right apical pneumothorax. Electronically Signed   By: Alcide CleverMark  Lukens M.D.   On: 07/31/2016 07:45    ASSESSMENT AND PLAN:   Principal Problem:   Severe major depression, single episode, without psychotic features (HCC) Active Problems:   Drug overdose   Substance abuse   Suicide attempt (HCC)   Pneumothorax on right   Amphetamine abuse  * Drug overdose - suicidal- methamphatemine and zanaflex  Monitored on Telemetry.   Stable now.  *  Substance abuse Counseled this admission  *  Suicide attempt with severe depression. Discussed with Dr. Toni Amendlapacs of psychiatry. Patient will be transferred to behavioral health unit when bed available.  *  Pneumothorax, right - chest tube placed and now removed. No SOB/CP F/U with Dr. Thelma Bargeaks as OP   All the records are reviewed and case discussed with Care Management/Social Worker Management plans discussed with the patient, family and they are in agreement.  CODE STATUS: full.  TOTAL TIME TAKING CARE OF THIS PATIENT: 25 minutes.   [facility]  Orie FishermanSudini, Carlo Lorson R M.D on 08/01/2016   Between 7am to 6pm - Pager - [phone]  After 6pm go to [email or web address] - password [email or web address]  [facility] [place] Hospital[facility]  Office  [phone]  CC: Primary care physician; Evelene CroonNiemeyer, Meindert, MD  Note: This dictation was prepared with Dragon dictation along with smaller  phrase technology. Any transcriptional errors that result from this process are unintentional.

## 2016-08-01 NOTE — Consult Note (Signed)
[place] Psychiatry Consult   Reason for Consult:  Follow-up consult for 46 year old man with a history of substance abuse who came in after a suicide attempt Referring Physician:  Sudini Patient Identification: Richard Harrington MRN:  [id number] Principal Diagnosis: Severe major depression, single episode, without psychotic features [facility]) Diagnosis:   Patient Active Problem List   Diagnosis Date Noted  . Severe major depression, single episode, without psychotic features ([place]) [F32.2] 07/31/2016  . Amphetamine abuse [F15.10] 07/30/2016  . Drug overdose [T50.901A] 07/29/2016  . Substance abuse [F19.10] 07/29/2016  . Suicide attempt ([place]) [T14.91XA] 07/29/2016  . Pneumothorax on right [J93.9] 07/29/2016  . Inguinal hernia [K40.90]     Total Time spent with patient: 20 minutes  Subjective:   Richard Harrington is a 46 y.o. male patient admitted with "I'm not feeling good".  HPI: This is a follow-up note for Wednesday, June 20. Patient seen. Spoke with TTS and spoke with hospitalist and spoke with nursing. Patient is feeling physically better. Appetite has returned. Eating better. Able to ambulate without difficulty on his own. Chest tube has been out for over a day and he is breathing fine. Mood is still feeling very depressed down and negative with passive suicidal thoughts. Affect still down and blunted. Patient is requesting treatment for depression.  Past Psychiatric History: History of opiate abuse recent relapse history of depression and suicide attempt  Risk to Self: Is patient at risk for suicide?: Yes Risk to Others:   Prior Inpatient Therapy:   Prior Outpatient Therapy:    Past Medical History:  Past Medical History:  Diagnosis Date  . Depression   . Inguinal hernia    left  . Substance abuse     Past Surgical History:  Procedure Laterality Date  . INCISION AND DRAINAGE ABSCESS     tonsil abscess   Family History:  Family History  Problem Relation Age of  Onset  . Colon cancer Neg Hx   . Breast cancer Neg Hx    Family Psychiatric  History: Unknown Social History:  History  Alcohol Use  . Yes    Comment: occasional     History  Drug Use  . Types: Methamphetamines    Social History   Social History  . Marital status: Single    Spouse name: N/A  . Number of children: N/A  . Years of education: N/A   Social History Main Topics  . Smoking status: Former Smoker    Packs/day: 0.50    Years: 24.00    Types: Cigarettes  . Smokeless tobacco: Never Used  . Alcohol use Yes     Comment: occasional  . Drug use: Yes    Types: Methamphetamines  . Sexual activity: Not Asked   Other Topics Concern  . None   Social History Narrative  . None   Additional Social History:    Allergies:  No Known Allergies  Labs:  Results for orders placed or performed during the hospital encounter of 07/29/16 (from the past 48 hour(s))  CBC     Status: Abnormal   Collection Time: 08/01/16  6:29 AM  Result Value Ref Range   WBC 5.3 3.8 - 10.6 K/uL   RBC 4.30 (L) 4.40 - 5.90 MIL/uL   Hemoglobin 12.9 (L) 13.0 - 18.0 g/dL   HCT 37.1 (L) 40.0 - 52.0 %   MCV 86.3 80.0 - 100.0 fL   MCH 29.9 26.0 - 34.0 pg   MCHC 34.6 32.0 - 36.0 g/dL  RDW 13.1 11.5 - 14.5 %   Platelets 139 (L) 150 - 440 K/uL  Basic metabolic panel     Status: Abnormal   Collection Time: 08/01/16  6:29 AM  Result Value Ref Range   Sodium 138 135 - 145 mmol/L   Potassium 3.9 3.5 - 5.1 mmol/L   Chloride 106 101 - 111 mmol/L   CO2 29 22 - 32 mmol/L   Glucose, Bld 89 65 - 99 mg/dL   BUN 10 6 - 20 mg/dL   Creatinine, Ser 0.63 0.61 - 1.24 mg/dL   Calcium 8.5 (L) 8.9 - 10.3 mg/dL   GFR calc non Af Amer >60 >60 mL/min   GFR calc Af Amer >60 >60 mL/min    Comment: (NOTE) The eGFR has been calculated using the CKD EPI equation. This calculation has not been validated in all clinical situations. eGFR's persistently <60 mL/min signify possible Chronic Kidney Disease.    Anion gap  3 (L) 5 - 15    Current Facility-Administered Medications  Medication Dose Route Frequency Provider Last Rate Last Dose  . docusate sodium (COLACE) capsule 100 mg  100 mg Oral BID Vaughan Basta, MD      . enoxaparin (LOVENOX) injection 40 mg  40 mg Subcutaneous Q24H Lance Coon, MD   40 mg at 08/01/16 0905  . FLUoxetine (PROZAC) capsule 20 mg  20 mg Oral Daily Yaneisy Wenz T, MD      . ondansetron [facility]) tablet 4 mg  4 mg Oral Q6H PRN Lance Coon, MD       Or  . ondansetron [facility]) injection 4 mg  4 mg Intravenous Q6H PRN Lance Coon, MD      . oxyCODONE-acetaminophen (PERCOCET/ROXICET) 5-325 MG per tablet 1 tablet  1 tablet Oral Q6H PRN Vaughan Basta, MD   1 tablet at 07/31/16 1309    Musculoskeletal: Strength & Muscle Tone: within normal limits Gait & Station: normal Patient leans: N/A  Psychiatric Specialty Exam: Physical Exam  Nursing note and vitals reviewed. Constitutional: He appears well-developed and well-nourished.  HENT:  Head: Normocephalic and atraumatic.  Eyes: Conjunctivae are normal. Pupils are equal, round, and reactive to light.  Neck: Normal range of motion.  Cardiovascular: Normal heart sounds.   Respiratory: Effort normal.  GI: Soft.  Musculoskeletal: Normal range of motion.  Neurological: He is alert.  Skin: Skin is warm and dry.  Psychiatric: His speech is delayed. He is slowed. Cognition and memory are impaired. He exhibits a depressed mood. He expresses suicidal ideation.    Review of Systems  Constitutional: Negative.   HENT: Negative.   Eyes: Negative.   Respiratory: Negative.   Cardiovascular: Negative.   Gastrointestinal: Negative.   Musculoskeletal: Negative.   Skin: Negative.   Neurological: Negative.   Psychiatric/Behavioral: Positive for depression, substance abuse and suicidal ideas. Negative for hallucinations. The patient has insomnia. The patient is not nervous/anxious.     Blood pressure 115/71, pulse 78,  temperature 97.8 F (36.6 C), temperature source Oral, resp. rate 16, height 5' 9"  (1.753 m), weight 130 lb (59 kg), SpO2 97 %.Body mass index is 19.2 kg/m.  General Appearance: Casual  Eye Contact:  Minimal  Speech:  Slow  Volume:  Decreased  Mood:  Depressed  Affect:  Depressed  Thought Process:  Goal Directed  Orientation:  Full (Time, Place, and Person)  Thought Content:  Rumination  Suicidal Thoughts:  Yes.  with intent/plan  Homicidal Thoughts:  No  Memory:  Immediate;   Fair Recent;  Fair Remote;   Fair  Judgement:  Fair  Insight:  Fair  Psychomotor Activity:  Decreased  Concentration:  Concentration: Fair  Recall:  [employer] of Knowledge:  Fair  Language:  Fair  Akathisia:  No  Handed:  Right  AIMS (if indicated):     Assets:  Desire for Improvement Physical Health Resilience  ADL's:  Intact  Cognition:  WNL  Sleep:        Treatment Plan Summary: Daily contact with patient to assess and evaluate symptoms and progress in treatment, Medication management and Plan Patient continues to be depressed down flat negative and have passive suicidal thoughts and be at high risk for suicidality. Continues to meet criteria and be appropriate for inpatient hospital level treatment. Spoke with TTS and we do not have beds immediately available but I have asked that patient be at the top of the list for consideration for transfer downstairs. Meanwhile I will be starting him on Prozac 20 mg a day for treatment of depression. Patient advised of plan and agreeable. I think we can take him off the commitment and the suicide precautions. He has been cooperative and right minded and I don't think he is at high risk to harm himself in the hospital.  Disposition: Recommend psychiatric Inpatient admission when medically cleared. Supportive therapy provided about ongoing stressors.  Alethia Berthold, MD 08/01/2016 2:16 PM

## 2016-08-01 NOTE — Discharge Instructions (Signed)
Regular diet. °Activity as tolerated. °

## 2016-08-01 NOTE — Plan of Care (Signed)
Problem: Pain Managment: Goal: General experience of comfort will improve Outcome: Progressing No complaints of pain this shift, especially since chest tube was removed during the day. Will continue to monitor.

## 2016-08-01 NOTE — Progress Notes (Signed)
Patient presently lying in the bed, alert and oriented, denies any pain, vss, I:1 sitter D/C per dr clapacs, mood calm, no abnormal behavior noted

## 2016-08-02 DIAGNOSIS — J449 Chronic obstructive pulmonary disease, unspecified: Secondary | ICD-10-CM

## 2016-08-02 DIAGNOSIS — F1521 Other stimulant dependence, in remission: Secondary | ICD-10-CM

## 2016-08-02 DIAGNOSIS — F1520 Other stimulant dependence, uncomplicated: Secondary | ICD-10-CM

## 2016-08-02 DIAGNOSIS — F332 Major depressive disorder, recurrent severe without psychotic features: Principal | ICD-10-CM

## 2016-08-02 DIAGNOSIS — F1421 Cocaine dependence, in remission: Secondary | ICD-10-CM

## 2016-08-02 LAB — TSH: TSH: 1.456 u[IU]/mL (ref 0.350–4.500)

## 2016-08-02 LAB — VITAMIN B12: Vitamin B-12: 346 pg/mL (ref 180–914)

## 2016-08-02 MED ORDER — ALBUTEROL SULFATE HFA 108 (90 BASE) MCG/ACT IN AERS
1.0000 | INHALATION_SPRAY | RESPIRATORY_TRACT | Status: DC | PRN
  Filled 2016-08-02: qty 6.7

## 2016-08-02 MED ORDER — TRAZODONE HCL 50 MG PO TABS
50.0000 mg | ORAL_TABLET | Freq: Every evening | ORAL | Status: DC | PRN
  Filled 2016-08-02: qty 1

## 2016-08-02 NOTE — [facility] Suicide Risk Assessment (Signed)
[facility]   Nursing information obtained from:    Demographic factors:    Current Mental Status:    Loss Factors:    Historical Factors:    Risk Reduction Factors:     Total Time spent with patient:  Principal Problem: Severe recurrent major depression without psychotic features [facility](HCC) Diagnosis:   Patient Active Problem List   Diagnosis Date Noted  . Cocaine use disorder, severe, dependence (HCC) [F14.20] 08/02/2016  . 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 COPD (chronic obstructive pulmonary disease) (HCC) [J44.9] 08/02/2016  . Severe recurrent major depression without psychotic features (HCC) [F33.2] 08/01/2016  . Inguinal hernia [K40.90]    Subjective Data:   Continued Clinical Symptoms:    The "Alcohol Use Disorders Identification Test", Guidelines for Use in Primary Care, Second Edition.  World Science writerHealth Organization [facility](WHO). Score between 0-7:  no or low risk or alcohol related problems. Score between 8-15:  moderate risk of alcohol related problems. Score between 16-19:  high risk of alcohol related problems. Score 20 or above:  warrants further diagnostic evaluation for alcohol dependence and treatment.   CLINICAL FACTORS:   Depression:   Impulsivity Alcohol/Substance Abuse/Dependencies Previous Psychiatric Diagnoses and Treatments     Psychiatric Specialty Exam: Physical Exam  ROS  Blood pressure 101/68, pulse 96, temperature 97.8 F (36.6 C), temperature source Oral, resp. rate 18, height 5\' 9"  (1.753 m), weight 55.3 kg (122 lb), SpO2 99 %.Body mass index is 18.02 kg/m.                                                    Sleep:  Number of Hours: 8      COGNITIVE FEATURES THAT CONTRIBUTE TO RISK:  Polarized thinking    SUICIDE RISK:   Moderate:  Frequent suicidal ideation with limited intensity, and duration, some specificity in terms of plans, no associated intent, good  self-control, limited dysphoria/symptomatology, some risk factors present, and identifiable protective factors, including available and accessible social support.  PLAN OF CARE: admit  I certify that inpatient services furnished can reasonably be expected to improve the patient's condition.   Jimmy FootmanHernandez-Gonzalez,  Deward Sebek, MD 08/02/2016, 9:40 AM

## 2016-08-02 NOTE — H&P (Signed)
Psychiatric Admission Assessment Adult  Patient Identification: MALIKI GIGNAC MRN:  [id number] Date of Evaluation:  08/02/2016 Chief Complaint:  depression Principal Diagnosis: Severe recurrent major depression without psychotic features [facility]) Diagnosis:   Patient Active Problem List   Diagnosis Date Noted  . Methamphetamine use disorder, severe ([place]) [F15.20] 08/02/2016  . COPD (chronic obstructive pulmonary disease) ([place]) [J44.9] 08/02/2016  . Cocaine use disorder, severe, in sustained remission ([place]) [F14.21] 08/02/2016  . Amphetamine use disorder, severe, in sustained remission ([place]) [F15.21] 08/02/2016  . Severe recurrent major depression without psychotic features ([place]) [F33.2] 08/01/2016  . Inguinal hernia [K40.90]    History of Present Illness:   46 year old divorced Caucasian male. Who presented to our emergency department via EMS on June 17 after an overdose. The patient has a history of addiction to cocaine and methamphetamine. He had been sober for the last 8 months. For the last month the patient started developing symptoms of depression. He became suicidal after relapsing just a few days ago the patient decided to overdose on methamphetamines and tizanidine.  While in the emergency department he was found to have a pneumothorax. He was admitted to the medical floor and transferred to our unit yesterday.  Today the patient reports having is still depressed mood but no longer having suicidality. He denies homicidality or auditory or visual hallucinations. Denies any symptoms consistent with mania or hypomania. He denies any current stressors. He is unemployed. He has 3 children that he gets to see often. He has a supportive family.  Appears that the only major stressor recently has been his relapse.  Trauma history denies  Substance abuse history patient has been struggling with methamphetamine and cocaine addiction for the last 9 years. Prior to that he was abusing  amphetamines and cocaine. Claims that he has not used it from his cocaine in several years. Denies the use of any other illicit substances or alcohol.  Patient had a recent overdose on meth for which she was treated at [facility]. He was referred to [facility] and from [place] he went to [facility] for residential treatment.   Associated Signs/Symptoms: Depression Symptoms:  depressed mood, insomnia, fatigue, feelings of worthlessness/guilt, suicidal attempt, (Hypo) Manic Symptoms:  Impulsivity, Anxiety Symptoms:  Excessive Worry, Psychotic Symptoms:  denies PTSD Symptoms: Negative Total Time spent with patient: 1 hour  Past Psychiatric History: No prior psychiatric hospitalizations. Denies any history of suicidal attempts. Not following up outpatient with provider. Recently discharged from [facility] after 55 days of treatment  Is the patient at risk to self? Yes.    Has the patient been a risk to self in the past 6 months? No.  Has the patient been a risk to self within the distant past? No.  Is the patient a risk to others? No.  Has the patient been a risk to others in the past 6 months? No.  Has the patient been a risk to others within the distant past? No.    Alcohol Screening: 1. How often do you have a drink containing alcohol?: Never 9. Have you or someone else been injured as a result of your drinking?: No 10. Has a relative or friend or a doctor or another health worker been concerned about your drinking or suggested you cut down?: No Alcohol Use Disorder Identification Test Final Score (AUDIT): 0 Brief Intervention: AUDIT score less than 7 or less-screening does not suggest unhealthy drinking-brief intervention not indicated Substance Abuse History in the last 12 months:  Yes.  Past Medical History: Patient reports having a history of a seizure after overdosing on methamphetamine. He also has been diagnosed recently with COPD. He has inguinal hernia and is  scheduled for surgery on July 30 Past Medical History:  Diagnosis Date  . Depression   . Inguinal hernia    left  . Pneumohemothorax   . Substance abuse     Past Surgical History:  Procedure Laterality Date  . INCISION AND DRAINAGE ABSCESS     tonsil abscess   Family History:  Family History  Problem Relation Age of Onset  . Colon cancer Neg Hx   . Breast cancer Neg Hx    Family Psychiatric  History: Does not know of any family history of mental illness  Tobacco Screening: Have you used any form of tobacco in the last 30 days? (Cigarettes, Smokeless Tobacco, Cigars, and/or Pipes): Yes Tobacco use, Select all that apply: 4 or less cigarettes per day Are you interested in Tobacco Cessation Medications?: No, patient refused Counseled patient on smoking cessation including recognizing danger situations, developing coping skills and basic information about quitting provided: Refused/Declined practical counseling   Social History:  History  Alcohol Use No    Comment: occasional     History  Drug Use  . Frequency: 7.0 times per week  . Types: Methamphetamines    Additional Social History:      History of alcohol / drug use?: Yes Longest period of sobriety (when/how long): 8 months Negative Consequences of Use: Personal relationships, Financial, Legal Name of Substance 1: meth      Allergies:  No Known Allergies   Lab Results:  Results for orders placed or performed during the hospital encounter of 07/29/16 (from the past 48 hour(s))  CBC     Status: Abnormal   Collection Time: 08/01/16  6:29 AM  Result Value Ref Range   WBC 5.3 3.8 - 10.6 K/uL   RBC 4.30 (L) 4.40 - 5.90 MIL/uL   Hemoglobin 12.9 (L) 13.0 - 18.0 g/dL   HCT 37.1 (L) 40.0 - 52.0 %   MCV 86.3 80.0 - 100.0 fL   MCH 29.9 26.0 - 34.0 pg   MCHC 34.6 32.0 - 36.0 g/dL   RDW 13.1 11.5 - 14.5 %   Platelets 139 (L) 150 - 440 K/uL  Basic metabolic panel     Status: Abnormal   Collection Time: 08/01/16  6:29  AM  Result Value Ref Range   Sodium 138 135 - 145 mmol/L   Potassium 3.9 3.5 - 5.1 mmol/L   Chloride 106 101 - 111 mmol/L   CO2 29 22 - 32 mmol/L   Glucose, Bld 89 65 - 99 mg/dL   BUN 10 6 - 20 mg/dL   Creatinine, Ser 0.63 0.61 - 1.24 mg/dL   Calcium 8.5 (L) 8.9 - 10.3 mg/dL   GFR calc non Af Amer >60 >60 mL/min   GFR calc Af Amer >60 >60 mL/min    Comment: (NOTE) The eGFR has been calculated using the CKD EPI equation. This calculation has not been validated in all clinical situations. eGFR's persistently <60 mL/min signify possible Chronic Kidney Disease.    Anion gap 3 (L) 5 - 15    Blood Alcohol level:  Lab Results  Component Value Date   ETH <5 33/00/7622    Metabolic Disorder Labs:  No results found for: HGBA1C, MPG No results found for: PROLACTIN No results found for: CHOL, TRIG, HDL, CHOLHDL, VLDL, LDLCALC  Current Medications: Current Facility-Administered  Medications  Medication Dose Route Frequency Provider Last Rate Last Dose  . acetaminophen (TYLENOL) tablet 650 mg  650 mg Oral Q6H PRN Clapacs, John T, MD      . albuterol (PROVENTIL HFA;VENTOLIN HFA) 108 (90 Base) MCG/ACT inhaler 1-2 puff  1-2 puff Inhalation Q4H PRN Hildred Priest, MD      . alum & mag hydroxide-simeth (MAALOX/MYLANTA) 200-200-20 MG/5ML suspension 30 mL  30 mL Oral Q4H PRN Clapacs, John T, MD      . FLUoxetine (PROZAC) capsule 20 mg  20 mg Oral Daily Clapacs, John T, MD   20 mg at 08/02/16 1308  . hydrOXYzine (ATARAX/VISTARIL) tablet 25 mg  25 mg Oral TID PRN Clapacs, Madie Reno, MD      . ibuprofen (ADVIL,MOTRIN) tablet 600 mg  600 mg Oral Q6H PRN Clapacs, John T, MD      . magnesium hydroxide (MILK OF MAGNESIA) suspension 30 mL  30 mL Oral Daily PRN Hildred Priest, MD      . traZODone (DESYREL) tablet 50 mg  50 mg Oral QHS PRN Hildred Priest, MD       PTA Medications: Prescriptions Prior to Admission  Medication Sig Dispense Refill Last Dose  . meloxicam  (MOBIC) 7.5 MG tablet Take 7.5 mg by mouth 2 (two) times daily.   Past Week at Unknown time  . FLUoxetine (PROZAC) 20 MG capsule Take 1 capsule (20 mg total) by mouth daily.  3 08/01/2016    Musculoskeletal: Strength & Muscle Tone: within normal limits Gait & Station: normal Patient leans: N/A  Psychiatric Specialty Exam: Physical Exam  Constitutional: He is oriented to person, place, and time. He appears well-developed and well-nourished.  HENT:  Head: Normocephalic and atraumatic.  Eyes: Conjunctivae and EOM are normal.  Neck: Normal range of motion.  Respiratory: Effort normal.  Musculoskeletal: Normal range of motion.  Neurological: He is alert and oriented to person, place, and time.    Review of Systems  Constitutional: Negative.   HENT: Negative.   Eyes: Negative.   Respiratory: Negative.   Cardiovascular: Negative.   Gastrointestinal: Negative.   Genitourinary: Negative.   Musculoskeletal: Negative.   Skin: Negative.   Neurological: Negative.   Endo/Heme/Allergies: Negative.   Psychiatric/Behavioral: Positive for depression and substance abuse.    Blood pressure 101/68, pulse 96, temperature 97.8 F (36.6 C), temperature source Oral, resp. rate 18, height 5' 9"  (1.753 m), weight 55.3 kg (122 lb), SpO2 99 %.Body mass index is 18.02 kg/m.  General Appearance: Fairly Groomed  Eye Contact:  Good  Speech:  Clear and Coherent  Volume:  Normal  Mood:  Dysphoric  Affect:  Blunt  Thought Process:  Linear and Descriptions of Associations: Intact  Orientation:  Full (Time, Place, and Person)  Thought Content:  Hallucinations: None  Suicidal Thoughts:  No  Homicidal Thoughts:  No  Memory:  Immediate;   Good Recent;   Good Remote;   Good  Judgement:  Fair  Insight:  Fair  Psychomotor Activity:  Normal  Concentration:  Concentration: Good and Attention Span: Good  Recall:  Good  Fund of Knowledge:  Good  Language:  Good  Akathisia:  No  Handed:    AIMS (if  indicated):     Assets:  Communication Skills Physical Health  ADL's:  Intact  Cognition:  WNL  Sleep:  Number of Hours: 8    Treatment Plan Summary:  Patient is a 46 year old Caucasian male with a history of severe cocaine and methamphetamine dependence. Patient  has been struggling to stay sober. He relapsed recently after being clean for 8 months. He became suicidal and decided to overdose on methamphetamine and a muscle relaxant.  Major depressive disorder continue fluoxetine 20 mg a day  Insomnia continue trazodone 100 mg by mouth daily at bedtime when necessary  Methamphetamine addiction: We'll refer her to intensive outpatient substance abuse services. Patient also was advised to follow up with a support group such as NA or AA  Cocaine and amphetamine use disorder: Patient stated that this has been in remission for many years.  COPD continue albuterol inhaler as needed   Diet regular  Precautions every 15 minute checks  Hospitalization and status will change to voluntary  Vital signs daily  Dispo: back home once stable  Follow-up to be determined  Labs will order TSH and B12   Physician Treatment Plan for Primary Diagnosis: Severe recurrent major depression without psychotic features ([place]) Long Term Goal(s): Improvement in symptoms so as ready for discharge  Short Term Goals: Ability to identify changes in lifestyle to reduce recurrence of condition will improve, Ability to disclose and discuss suicidal ideas and Ability to identify and develop effective coping behaviors will improve  Physician Treatment Plan for Secondary Diagnosis: Principal Problem:   Severe recurrent major depression without psychotic features ([place]) Active Problems:   Methamphetamine use disorder, severe (HCC)   COPD (chronic obstructive pulmonary disease) ([place])   Cocaine use disorder, severe, in sustained remission ([place])   Amphetamine use disorder, severe, in sustained remission  ([place])  Long Term Goal(s): Improvement in symptoms so as ready for discharge  Short Term Goals: Ability to identify triggers associated with substance abuse/mental health issues will improve  I certify that inpatient services furnished can reasonably be expected to improve the patient's condition.    Hildred Priest, MD 6/21/201811:36 AM

## 2016-08-02 NOTE — [facility] Group Notes (Signed)
[facility] LCSW Group Therapy Note  Type of Therapy and Topic:  Group Therapy:  Goals Group: SMART Goals  Participation Level:  Patient did not attend group. CSW invited patient to group.   Description of Group:   The purpose of a daily goals group is to assist and guide patients in setting recovery/wellness-related goals.  The objective is to set goals as they relate to the crisis in which they were admitted. Patients will be using SMART goal modalities to set measurable goals.  Characteristics of realistic goals will be discussed and patients will be assisted in setting and processing how one will reach their goal. Facilitator will also assist patients in applying interventions and coping skills learned in psycho-education groups to the SMART goal and process how one will achieve defined goal.  Therapeutic Goals: -Patients will develop and document one goal related to or their crisis in which brought them into treatment. -Patients will be guided by LCSW using SMART goal setting modality in how to set a measurable, attainable, realistic and time sensitive goal.  -Patients will process barriers in reaching goal. -Patients will process interventions in how to overcome and successful in reaching goal.   Summary of Patient Progress:  Patient Goal: None identified at this time.    Therapeutic Modalities:   Motivational Interviewing Engineer, manufacturing systemsCognitive Behavioral Therapy Crisis Intervention Model SMART goals setting  Beverly Ferner G. Garnette CzechSampson MSW, Lakewalk Surgery CenterCSWA 08/02/2016 10:58 AM

## 2016-08-02 NOTE — Plan of Care (Signed)
Problem: Medication: Goal: Compliance with prescribed medication regimen will improve Outcome: Progressing Compliant with medications.     

## 2016-08-02 NOTE — [facility] Group Notes (Signed)
[facility] LCSW Group Therapy  08/02/2016 3:24 PM  Type of Therapy:  Group Therapy  Participation Level:  Active  Participation Quality:  Attentive  Affect:  Appropriate and Flat  Cognitive:  Alert  Insight:  Limited  Engagement in Therapy:  Engaged  Modes of Intervention:  Activity, Discussion, Education, Problem-solving, Dance movement psychotherapisteality Testing, Socialization and Support  Summary of Progress/Problems: Balance in life: Patients will discuss the concept of balance and how it looks and feels to be unbalanced. Pt will identify areas in their life that is unbalanced and ways to become more balanced. They discussed what aspects in their lives has influenced their self care. Patients also discussed self care in the areas of self regulation/control, hygiene/appearance, sleep/relaxation, healthy leisure, healthy eating habits, exercise, inner peace/spirituality, self improvement, sobriety, and health management. They were challenged to identify changes that are needed in order to improve self care.  Richard Harrington MSW, LCSWA 08/02/2016, 3:24 PM

## 2016-08-02 NOTE — Progress Notes (Signed)
Patient in bed resting with eyes closed. Affect flat, sad and depressed. Alert, responsive and cooperative. Up ad lib with a steady gait. Compliant with meals and medications. Denies SI, AVH, milieu remains safe with q 15 minute checks.

## 2016-08-02 NOTE — H&P (Signed)
Psychiatric Admission Assessment Adult  Patient Identification: Richard Harrington MRN:  [id number] Date of Evaluation:  08/02/2016 Chief Complaint:  depression Principal Diagnosis: Severe recurrent major depression without psychotic features [facility])   Diagnosis:   Patient Active Problem List   Diagnosis Date Noted  . Cocaine use disorder, severe, dependence ([place]) [F14.20] 08/02/2016  . Methamphetamine use disorder, severe ([place]) [F15.20] 08/02/2016  . COPD (chronic obstructive pulmonary disease) ([place]) [J44.9] 08/02/2016  . Severe recurrent major depression without psychotic features ([place]) [F33.2] 08/01/2016  . Inguinal hernia [K40.90]    History of Present Illness:   Richard Harrington is a 46 yo caucasian male who was admitted to the medical unit a few days ago for a methamphetamine and muscle relaxer overdose with suicidal intention, and a past history of meth abuse.  He reports that for the past 1-2  mnths he has been feeling depressed, and felt like that he no longer wanted to live which lead to the attempted suicide.  He has no prior suicide attempts. He had been clean for almost 8 months prior to this admission.   About 8 months ago he had unintentionally OD and was hospitalized. From there,  he was sent to [facility] in [place], who then referred him to [facility] where he stayed for 55 days.  Upon discharge from rehab, he continued to attend NA which he said was very helpful.  Unfortunately, he said that he had to stop attending the meetings due to lack of time, which he feels played a role in his depression and relapse.  He does not see a counselor or a psychiatrist. Pt reports that he was "eating" meth everyday before he went to rehab, and has a past history of cocaine, adderall and marijuana abuse.  He also admits to having a seizure that was related to meth use, but does not remember how long ago that was. He denies traumatic events or stressors, head trauma, homicidal ideations,  symptoms of mania/hypomania, delusions, or hallucinations.  Associated Signs/Symptoms: Depression Symptoms:  depressed mood, suicidal attempt, (Hypo) Manic Symptoms:  none present Anxiety Symptoms:  none present Psychotic Symptoms:  none present PTSD Symptoms: none present Total Time spent with patient: 45 minutes  Past Psychiatric History: Pt has a history of substance abuse and addiction, and has OD multiple times.  Before rehab and relapse, Most recent addiction to methamphetamine started about 9 years ago and he used everyday.  As mentioned in HPI, he has abused cocaine, adderall, and marijuana in the past.   Is the patient at risk to self? Yes.    Has the patient been a risk to self in the past 6 months? No.  Has the patient been a risk to self within the distant past? No.  Is the patient a risk to others? No.  Has the patient been a risk to others in the past 6 months? No.  Has the patient been a risk to others within the distant past? No.  Prior Inpatient Therapy:  Patient attended rehab at [facility] for 55 days where he participated in group therapy and one-on-one counseling sessions. Prior Outpatient Therapy:  Patient has never seen a psychiatrist.   Alcohol Screening: 1. How often do you have a drink containing alcohol?: Never 9. Have you or someone else been injured as a result of your drinking?: No 10. Has a relative or friend or a doctor or another health worker been concerned about your drinking or suggested you cut down?: No Alcohol Use  Disorder Identification Test Final Score (AUDIT): 0 Brief Intervention: AUDIT score less than 7 or less-screening does not suggest unhealthy drinking-brief intervention not indicated Substance Abuse History in the last 12 months:  Yes.   Consequences of Substance Abuse: Medical Consequences:  Multiple overdoses including a seizure and hospital stays Previous Psychotropic Medications: No  Psychological Evaluations: Yes  Past Medical  History:  Past Medical History:  Diagnosis Date  . Depression   . Inguinal hernia    left  . Pneumohemothorax   . Substance abuse     Past Surgical History:  Procedure Laterality Date  . INCISION AND DRAINAGE ABSCESS     tonsil abscess   Family History:  Family History  Problem Relation Age of Onset  . Colon cancer Neg Hx   . Breast cancer Neg Hx    Family Psychiatric  History: No pertinent family history  Tobacco Screening: Have you used any form of tobacco in the last 30 days? (Cigarettes, Smokeless Tobacco, Cigars, and/or Pipes): Yes Tobacco use, Select all that apply: 4 or less cigarettes per day Are you interested in Tobacco Cessation Medications?: No, patient refused Counseled patient on smoking cessation including recognizing danger situations, developing coping skills and basic information about quitting provided: Refused/Declined practical counseling Social History:  History  Alcohol Use No    Comment: occasional     History  Drug Use  . Frequency: 7.0 times per week  . Types: Methamphetamines    Additional Social History:    Pt is divorced and has three children ages 46,14 and 7 whom he gets to see on occasion.  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  He has a 10th grade education.  He used to smoke about a pack a day, but quit after being diagnosed with COPD about 2 weeks ago.     History of alcohol / drug use?: Yes Longest period of sobriety (when/how long): 8 months Negative Consequences of Use: Personal relationships, Financial, Legal Name of Substance 1: meth                  Allergies:  No Known Allergies Lab Results:  Results for orders placed or performed during the hospital encounter of 07/29/16 (from the past 48 hour(s))  CBC     Status: Abnormal   Collection Time: 08/01/16  6:29 AM  Result Value Ref Range   WBC 5.3 3.8 - 10.6 K/uL   RBC 4.30 (L) 4.40 - 5.90 MIL/uL   Hemoglobin  12.9 (L) 13.0 - 18.0 g/dL   HCT 37.1 (L) 40.0 - 52.0 %   MCV 86.3 80.0 - 100.0 fL   MCH 29.9 26.0 - 34.0 pg   MCHC 34.6 32.0 - 36.0 g/dL   RDW 13.1 11.5 - 14.5 %   Platelets 139 (L) 150 - 440 K/uL  Basic metabolic panel     Status: Abnormal   Collection Time: 08/01/16  6:29 AM  Result Value Ref Range   Sodium 138 135 - 145 mmol/L   Potassium 3.9 3.5 - 5.1 mmol/L   Chloride 106 101 - 111 mmol/L   CO2 29 22 - 32 mmol/L   Glucose, Bld 89 65 - 99 mg/dL   BUN 10 6 - 20 mg/dL   Creatinine, Ser 0.63 0.61 - 1.24 mg/dL   Calcium 8.5 (L) 8.9 - 10.3 mg/dL   GFR calc non Af Amer >60 >60 mL/min   GFR calc Af  Amer >60 >60 mL/min    Comment: (NOTE) The eGFR has been calculated using the CKD EPI equation. This calculation has not been validated in all clinical situations. eGFR's persistently <60 mL/min signify possible Chronic Kidney Disease.    Anion gap 3 (L) 5 - 15    Blood Alcohol level:  Lab Results  Component Value Date   ETH <5 15/52/0802    Metabolic Disorder Labs:  No results found for: HGBA1C, MPG No results found for: PROLACTIN No results found for: CHOL, TRIG, HDL, CHOLHDL, VLDL, LDLCALC  Current Medications: Current Facility-Administered Medications  Medication Dose Route Frequency Provider Last Rate Last Dose  . acetaminophen (TYLENOL) tablet 650 mg  650 mg Oral Q6H PRN Clapacs, John T, MD      . albuterol (PROVENTIL HFA;VENTOLIN HFA) 108 (90 Base) MCG/ACT inhaler 1-2 puff  1-2 puff Inhalation Q4H PRN Hildred Priest, MD      . alum & mag hydroxide-simeth (MAALOX/MYLANTA) 200-200-20 MG/5ML suspension 30 mL  30 mL Oral Q4H PRN Clapacs, John T, MD      . FLUoxetine (PROZAC) capsule 20 mg  20 mg Oral Daily Clapacs, John T, MD   20 mg at 08/02/16 2336  . hydrOXYzine (ATARAX/VISTARIL) tablet 25 mg  25 mg Oral TID PRN Clapacs, Madie Reno, MD      . ibuprofen (ADVIL,MOTRIN) tablet 600 mg  600 mg Oral Q6H PRN Clapacs, John T, MD      . magnesium hydroxide (MILK OF  MAGNESIA) suspension 30 mL  30 mL Oral Daily PRN Clapacs, John T, MD      . traZODone (DESYREL) tablet 100 mg  100 mg Oral QHS PRN Clapacs, Madie Reno, MD       PTA Medications: Prescriptions Prior to Admission  Medication Sig Dispense Refill Last Dose  . meloxicam (MOBIC) 7.5 MG tablet Take 7.5 mg by mouth 2 (two) times daily.   Past Week at Unknown time  . FLUoxetine (PROZAC) 20 MG capsule Take 1 capsule (20 mg total) by mouth daily.  3 08/01/2016    Musculoskeletal: Strength & Muscle Tone: within normal limits Gait & Station: normal Patient leans: N/A  Psychiatric Specialty Exam: Physical Exam  Constitutional: He is oriented to person, place, and time.  Appears disheveled and tired, continually yawned during the interview, but cooperative and alert  Respiratory: Effort normal and breath sounds normal. No accessory muscle usage. He exhibits no tenderness.  Neurological: He is alert and oriented to person, place, and time.  Skin: Skin is warm and dry.  Psychiatric: His speech is normal and behavior is normal. His affect is blunt. Cognition and memory are normal. He exhibits a depressed mood.    Review of Systems  Constitutional: Negative for chills and fever.  Cardiovascular: Negative for chest pain and orthopnea.  Gastrointestinal: Negative for abdominal pain, nausea and vomiting.  Genitourinary: Negative for dysuria.  Neurological: Positive for headaches. Negative for dizziness and seizures.  Psychiatric/Behavioral: Positive for depression and substance abuse. Negative for hallucinations and suicidal ideas. The patient is not nervous/anxious and does not have insomnia.     Blood pressure 101/68, pulse 96, temperature 97.8 F (36.6 C), temperature source Oral, resp. rate 18, height 5' 9"  (1.753 m), weight 55.3 kg (122 lb), SpO2 99 %.Body mass index is 18.02 kg/m.  General Appearance: Disheveled and appears tired  Eye Contact:  Good  Speech:  Clear and Coherent and Normal Rate,    Volume:  Normal  Mood:  Dysphoric  Affect:  Constricted  and blunt  Thought Process:  Coherent, Linear and Descriptions of Associations: Intact  Orientation:  Full (Time, Place, and Person)  Thought Content:  Logical  Suicidal Thoughts:  No  Homicidal Thoughts:  No  Memory:  Immediate;   Good Recent;   Good Remote;   Good  Judgement:  Fair  Insight:  Fair  Psychomotor Activity:  Normal  Concentration:  Concentration: Fair and Attention Span: Good  Recall:  Good  Fund of Knowledge:  Good  Language:  Good  Akathisia:  No  Handed:    AIMS (if indicated):     Assets:  Communication Skills Desire for Improvement Housing Social Support Transportation Vocational/Educational  ADL's:  Intact  Cognition:  WNL  Sleep:  Number of Hours: 8    Treatment Plan Summary: Daily contact with patient to assess and evaluate symptoms and progress in treatment  Mr. Desai is a 46 yo male who presents with severe recurrent major depression after attempting suicide a few days ago.  He is currently stable and denies any current suicidal or homicidal ideations, any hallucinations, delusions, or hypomania/mania symptoms.    1.  Severe Recurrent Major Depression:  Continue pt on  prozac 20 mg po qd and trazodone 50 mg po at night for sleep.  Continue to monitor patient and track progress.  2. Substance Abuse: Spoke with SW about placing pt in intensive outpatient.  Pt is willing and wants to attend depending on his work schedule.  3.  COPD: currently stable, continue pt on albuterol 1-2 puff q 4h prn  4. Inguinal hernia: stable, scheduled for repair next month  5. Smoking Cessation: offered nicotine patch, but pt declined.   Physician Treatment Plan for Primary Diagnosis: Severe recurrent major depression without psychotic features ([place]) Long Term Goal(s): Improvement in symptoms so as ready for discharge  Short Term Goals: Ability to identify changes in lifestyle to reduce recurrence of  condition will improve, Ability to demonstrate self-control will improve and Ability to identify and develop effective coping behaviors will improve  Physician Treatment Plan for Secondary Diagnosis: Principal Problem:   Severe recurrent major depression without psychotic features ([place]) Active Problems:   Cocaine use disorder, severe, dependence ([place])   Methamphetamine use disorder, severe ([place])   COPD (chronic obstructive pulmonary disease) ([place])   I certify that inpatient services furnished can reasonably be expected to improve the patient's condition.    Pasty Spillers, [place] 6/21/201811:01 AM

## 2016-08-02 NOTE — Progress Notes (Signed)
Pt appeared to sleep about 8 hours while monitored on 15 minute safety checks.   

## 2016-08-02 NOTE — B[facility] Counselor (Signed)
Adult Comprehensive Assessment  Patient ID: Richard Harrington, male   DOB: Mar 16, 1970, 46 y.o.   MRN: [id number]  Information Source: Information source: Patient  Current Stressors:  Substance abuse: relapsed and thought everyone would be better off without me around.  Pt reports he has been depressed for a while.  Living/Environment/Situation:  Living Arrangements: Alone Living conditions (as described by patient or guardian): No problems so far. How long has patient lived in current situation?: Pt has lived in a house alone since April 2018. What is atmosphere in current home: Comfortable  Family History:  Marital status: Divorced Divorced, when?: April 2018 What types of issues is patient dealing with in the relationship?: No current relationship. Are you sexually active?: No What is your sexual orientation?: heterosexual Has your sexual activity been affected by drugs, alcohol, medication, or emotional stress?: na Does patient have children?: Yes How many children?: 3 How is patient's relationship with their children?: 2 girls, 1 boy.  13, 14, 7.  Girls come around some.  Son is 7 and comes around quite often.  Childhood History:  By whom was/is the patient raised?: Both parents Additional childhood history information: Parents divorced at age 41.  Lived with mom/step dad for a while, went to live with father and grandfather at age 23.  I was pretty much able to do what I wanted.  Got mixed up with the wrong crowd. Description of patient's relationship with caregiver when they were a child: Dad-very little relationship.  Mom-good. Patient's description of current relationship with people who raised him/her: Good with mom.  Little contact with dad. How were you disciplined when you got in trouble as a child/adolescent?: Lack of discipline Does patient have siblings?: Yes Number of Siblings: 3 Description of patient's current relationship with siblings: all sisters.  So so  relationships, get together on some holidays.  Not close. Did patient suffer any verbal/emotional/physical/sexual abuse as a child?: No Did patient suffer from severe childhood neglect?: No Has patient ever been sexually abused/assaulted/raped as an adolescent or adult?: No Was the patient ever a victim of a crime or a disaster?: No Witnessed domestic violence?: No Has patient been effected by domestic violence as an adult?: No  Education:  Highest grade of school patient has completed: 10th grade Currently a student?: No Learning disability?: No  Employment/Work Situation:   Employment situation: Employed Where is patient currently employed?: [employer] How long has patient been employed?: 4 months Patient's job has been impacted by current illness: Yes Describe how patient's job has been impacted: job is aware of current issues, on short term disability What is the longest time patient has a held a job?: 8 years Where was the patient employed at that time?: [employer] Has patient ever been in the [employer]?: No Are There Guns or Other Weapons in Your Home?: No  Financial Resources:   Financial resources: Income from employment Does patient have a representative payee or guardian?:  (na)  Alcohol/Substance Abuse:   What has been your use of drugs/alcohol within the last 12 months?: alcohol: once per month, 2 beers.  Relapsed on meth: 2 weeks, 6 times total.  1/2 gram. Denies other drugs. If attempted suicide, did drugs/alcohol play a role in this?: Yes Alcohol/Substance Abuse Treatment Hx: Past Tx, Inpatient If yes, describe treatment: [facility] 2013, [facility] Flock [facility] 2017. Has alcohol/substance abuse ever caused legal problems?: No  Social Support System:   Patient's Community Support System: Fair Describe Community Support System: mom Type of  faith/religion: none How does patient's faith help to cope with current illness?: na  Leisure/Recreation:    Leisure and Hobbies: none  Strengths/Needs:   What things does the patient do well?: [facility] to get help [facility]. In what areas does patient struggle / problems for patient: Didn't handle my depression   Discharge Plan:   Does patient have access to transportation?: Yes Will patient be returning to same living situation after discharge?: Yes Currently receiving community mental health services: No If no, would patient like referral for services when discharged?: Yes (What county?) [employer]([place].  [facility] Does patient have financial barriers related to discharge medications?: No  Summary/Recommendations:   Summary and Recommendations (to be completed by the evaluator): Pt [place] 46 year [place]. [facility])  Pt diagnosed with major depressive disorder, methamphetamine and cocaine use disorder and admitted after a suicide attempt related to a recent relapse.  Recommendations for pt include crisis stabilization, therapeutic milieu, attend and participate in groups, medication management, and development of comprehensive mental wellness and substance use recovery plan.  Upon discharge, pt will be receiving outpatient services through [facility].  Lorri FrederickWierda, Richard Leino Jon. 08/02/2016

## 2016-08-03 NOTE — Progress Notes (Addendum)
[name] MD Progress Note  08/03/2016 1:18 PM Richard Harrington  MRN:  [id number] Subjective:  70[birth year][age] year old divorced Caucasian male. Who presented to our emergency department via EMS on June 17 after an overdose. The patient has a history of addiction to cocaine and methamphetamine. He had been sober for the last 8 months. For the last month the patient started developing symptoms of depression. He became suicidal after relapsing just a few days ago the patient decided to overdose on methamphetamines and tizanidine.  While in the emergency department he was found to have a pneumothorax. He was admitted to the medical floor and transferred to our unit yesterday.  Today the patient reports having is still depressed mood but no longer having suicidality. He denies homicidality or auditory or visual hallucinations. Denies any symptoms consistent with mania or hypomania. He denies any current stressors. He is unemployed. He has 3 children that he gets to see often. He has a supportive family.  6/22 Patient is feeling better today, slept well and ate well. Patent is also attending groups. Although he complains of feeling cloudy which he thinks is caused by the Prozac.  Patient denies suicidality, homicidality or hallucinations.  Per Nurse Patient in bed resting with eyes closed. Affect flat, sad and depressed. Alert, responsive and cooperative. Up ad lib with a steady gait. Compliant with meals and medications. Denies SI, AVH, milieu remains safe with q 15 minute checks    Principal Problem: Severe recurrent major depression without psychotic features (HCC) Diagnosis:   Patient Active Problem List   Diagnosis Date Noted  . 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 COPD (chronic obstructive pulmonary disease) (HCC) [J44.9] 08/02/2016  . Cocaine use disorder, severe, in sustained remission (HCC) [F14.21] 08/02/2016  . Amphetamine use disorder, severe, in sustained remission  (HCC) [F15.21] 08/02/2016  . Severe recurrent major depression without psychotic features (HCC) [F33.2] 08/01/2016  . Inguinal hernia [K40.90]       Trauma history denies  Substance abuse history patient has been struggling with methamphetamine and cocaine addiction for the last 9 years. Prior to that he was abusing amphetamines and cocaine. Claims that he has not used it from his cocaine in several years. Denies the use of any other illicit substances or alcohol.Patient had a recent overdose on meth for which she was treated at [facility]. He was referred to [facility] and from [facility] went to [facility] for residential treatment    Past Psychiatric History: No prior psychiatric hospitalizations. Denies any history of suicidal attempts. Not following up outpatient with provider. Recently discharged from [facility] after 55 days of treatment  Past Medical History:  Patient reports having a history of a seizure after overdosing on methamphetamine. He also has been diagnosed recently with COPD. He has inguinal hernia and is scheduled for surgery on July 30 Past Medical History:  Diagnosis Date  . Depression   . Inguinal hernia    left  . Pneumohemothorax   . Substance abuse     Past Surgical History:  Procedure Laterality Date  . INCISION AND DRAINAGE ABSCESS     tonsil abscess   Family History:  Family History  Problem Relation Age of Onset  . Colon cancer Neg Hx   . Breast cancer Neg Hx    Family Psychiatric  History:  Does not know of any family history of mental illness  Social History:  Patient is divorced and currently lives alone. He has three children that  he sees occasionally. His highest level of education is 10th grade. He works at night as a Counsellorprinter. History  Alcohol Use No    Comment: occasional     History  Drug Use  . Frequency: 7.0 times per week  . Types: Methamphetamines    Social History   Social History  . Marital status: Single     Spouse name: N/A  . Number of children: N/A  . Years of education: N/A   Social History Main Topics  . Smoking status: Former Smoker    Packs/day: 0.50    Years: 24.00    Types: Cigarettes  . Smokeless tobacco: Never Used  . Alcohol use No     Comment: occasional  . Drug use: Yes    Frequency: 7.0 times per week    Types: Methamphetamines  . Sexual activity: Not Asked   Other Topics Concern  . None   Social History Narrative  . None   Additional Social History:    History of alcohol / drug use?: Yes Longest period of sobriety (when/how long): 8 months Negative Consequences of Use: Personal relationships, Financial, Legal Name of Substance 1: meth       Current Medications: Current Facility-Administered Medications  Medication Dose Route Frequency Provider Last Rate Last Dose  . acetaminophen (TYLENOL) tablet 650 mg  650 mg Oral Q6H PRN Clapacs, John T, MD      . albuterol (PROVENTIL HFA;VENTOLIN HFA) 108 (90 Base) MCG/ACT inhaler 1-2 puff  1-2 puff Inhalation Q4H PRN Jimmy FootmanHernandez-Gonzalez, [place] Searles, MD      . alum & mag hydroxide-simeth (MAALOX/MYLANTA) 200-200-20 MG/5ML suspension 30 mL  30 mL Oral Q4H PRN Clapacs, John T, MD      . FLUoxetine (PROZAC) capsule 20 mg  20 mg Oral Daily Clapacs, Jackquline DenmarkJohn T, MD   20 mg at 08/03/16 [id number]  . hydrOXYzine (ATARAX/VISTARIL) tablet 25 mg  25 mg Oral TID PRN Clapacs, Jackquline DenmarkJohn T, MD      . ibuprofen (ADVIL,MOTRIN) tablet 600 mg  600 mg Oral Q6H PRN Clapacs, John T, MD      . magnesium hydroxide (MILK OF MAGNESIA) suspension 30 mL  30 mL Oral Daily PRN Jimmy FootmanHernandez-Gonzalez, Iokepa Geffre, MD      . traZODone (DESYREL) tablet 50 mg  50 mg Oral QHS PRN Jimmy FootmanHernandez-Gonzalez, Lizzet Hendley, MD        Lab Results: No results found for this or any previous visit (from the past 48 hour(s)).  Blood Alcohol level:  Lab Results  Component Value Date   ETH <5 07/29/2016    Metabolic Disorder Labs: No results found for: HGBA1C, MPG No results found for:  PROLACTIN No results found for: CHOL, TRIG, HDL, CHOLHDL, VLDL, LDLCALC  Physical Findings: AIMS: Facial and Oral Movements Muscles of Facial Expression: None, normal Lips and Perioral Area: None, normal Jaw: None, normal Tongue: None, normal,Extremity Movements Upper (arms, wrists, hands, fingers): None, normal Lower (legs, knees, ankles, toes): None, normal, Trunk Movements Neck, shoulders, hips: None, normal, Overall Severity Severity of abnormal movements (highest score from questions above): None, normal Incapacitation due to abnormal movements: None, normal Patient's awareness of abnormal movements (rate only patient's report): No Awareness, Dental Status Current problems with teeth and/or dentures?: No Does patient usually wear dentures?: No  CIWA:  CIWA-Ar Total: 0 COWS:     Musculoskeletal: Strength & Muscle Tone: within normal limits Gait & Station: normal Patient leans: N/A  Psychiatric Specialty Exam: Physical Exam  Constitutional: He is oriented to person, place, and  time. He appears well-developed and well-nourished.  HENT:  Head: Normocephalic and atraumatic.  Neck: Normal range of motion.  Respiratory: Effort normal.  Neurological: He is oriented to person, place, and time.    Review of Systems  Constitutional: Negative.   HENT: Negative.   Eyes: Negative.   Respiratory: Negative.   Cardiovascular: Negative.   Gastrointestinal: Negative.   Genitourinary: Negative.   Musculoskeletal: Negative.   Skin: Negative.   Neurological: Negative.   Endo/Heme/Allergies: Negative.   Psychiatric/Behavioral: Positive for depression and substance abuse. Negative for hallucinations, memory loss and suicidal ideas. The patient is not nervous/anxious and does not have insomnia.     Blood pressure 107/63, pulse 81, temperature 97.9 F (36.6 C), temperature source Oral, resp. rate 18, height 5\' 9"  (1.753 m), weight 55.3 kg (122 lb), SpO2 99 %.Body mass index is 18.02  kg/m.  General Appearance: Well Groomed  Eye Contact:  Good  Speech:  Clear and Coherent  Volume:  Normal  Mood:  Dysphoric  Affect:  Appropriate and Congruent  Thought Process:  Linear and Descriptions of Associations: Intact  Orientation:  Full (Time, Place, and Person)  Thought Content:  Hallucinations: None  Suicidal Thoughts:  No  Homicidal Thoughts:  No  Memory:  Immediate;   Good Recent;   Good Remote;   Good  Judgement:  Fair  Insight:  Fair  Psychomotor Activity:  Normal  Concentration:  Concentration: Good and Attention Span: Good  Recall:  Good  Fund of Knowledge:  Good  Language:  Good  Akathisia:  No  Handed:    AIMS (if indicated):     Assets:  Communication Skills Physical Health  ADL's:  Intact  Cognition:  WNL  Sleep:  Number of Hours: 7.75     Treatment Plan Summary:  Patient is a 46 year old Caucasian male with a history of severe cocaine and methamphetamine dependence. Patient has been struggling to stay sober. He relapsed recently after being clean for 8 months. He became suicidal and decided to overdose on methamphetamine and a muscle relaxant.  Major depressive disorder will continue fluoxetine 20 mg a day  Insomnia will continue trazodone 100 mg by mouth daily at bedtime when necessary  Methamphetamine addiction: Working on getting patient I to [facility]. Patient also advised to follow up with a support group such as NA or AA  Cocaine and amphetamine use disorder: Patient stated that this has been in remission for many years. Plan to refer patient for intensive care  COPD continue albuterol inhaler as needed  Diet regular  Precautions every 15 minute checks  Hospitalization and status will change to voluntary  Vital signs daily  Dispo: Discharge back home once stable  Follow-up to be determined--[facility] or [facility]  Labs: TSH and B12 are normal.    Jimmy Footman, MD 08/03/2016, 1:18 PM

## 2016-08-03 NOTE — Progress Notes (Signed)
D: Pt initially visible and social with select peers.  Denies current s/i,h/i or hallucinations. Displays a flat affect. Retired to bed early.  A: Pt monitored on 15 minute safety checks and maintained safety. R: Pt contracts for safety. Monitor safety and cont tx plan.

## 2016-08-03 NOTE — [facility] Group Notes (Signed)
[facility] LCSW Group Therapy  08/03/2016 10:44 AM  Type of Therapy:  Group Therapy  Participation Level:  Active  Participation Quality:  Attentive  Affect:  Flat  Cognitive:  Alert  Insight:  Limited  Engagement in Therapy:  Improving  Modes of Intervention:  Activity, Discussion, Education, Problem-solving, Reality Testing, Socialization and Support  Summary of Progress/Problems: Feelings around Relapse. Group members discussed the meaning of relapse and shared personal stories of relapse, how it affected them and others, and how they perceived themselves during this time. Group members were encouraged to identify triggers, warning signs and coping skills used when facing the possibility of relapse. Social supports were discussed and explored in detail. Patients also discussed facing disappointment and how that can trigger someone to relapse.  Avyaan Summer G. Garnette CzechSampson MSW, LCSWA 08/03/2016, 10:44 AM

## 2016-08-03 NOTE — Tx Team (Signed)
Interdisciplinary Treatment and Diagnostic Plan Update  08/03/2016 Time of Session: 11:00 AM Richard BailMichael S Harrington MRN: [id number]  Principal Diagnosis: Severe recurrent major depression without psychotic features [facility](HCC)  Secondary Diagnoses: Principal Problem:   Severe recurrent major depression without psychotic features (HCC) Active Problems:   Methamphetamine use disorder, severe (HCC)   COPD (chronic obstructive pulmonary disease) (HCC)   Cocaine use disorder, severe, in sustained remission (HCC)   Amphetamine use disorder, severe, in sustained remission (HCC)   Current Medications:  Current Facility-Administered Medications  Medication Dose Route Frequency Provider Last Rate Last Dose  . acetaminophen (TYLENOL) tablet 650 mg  650 mg Oral Q6H PRN Clapacs, John T, MD      . albuterol (PROVENTIL HFA;VENTOLIN HFA) 108 (90 Base) MCG/ACT inhaler 1-2 puff  1-2 puff Inhalation Q4H PRN Jimmy FootmanHernandez-Gonzalez, Andrea, MD      . alum & mag hydroxide-simeth (MAALOX/MYLANTA) 200-200-20 MG/5ML suspension 30 mL  30 mL Oral Q4H PRN Clapacs, John T, MD      . FLUoxetine (PROZAC) capsule 20 mg  20 mg Oral Daily Clapacs, John T, MD   20 mg at 08/03/16 0810  . hydrOXYzine (ATARAX/VISTARIL) tablet 25 mg  25 mg Oral TID PRN Clapacs, Jackquline DenmarkJohn T, MD      . ibuprofen (ADVIL,MOTRIN) tablet 600 mg  600 mg Oral Q6H PRN Clapacs, John T, MD      . magnesium hydroxide (MILK OF MAGNESIA) suspension 30 mL  30 mL Oral Daily PRN Jimmy FootmanHernandez-Gonzalez, Andrea, MD      . traZODone (DESYREL) tablet 50 mg  50 mg Oral QHS PRN Jimmy FootmanHernandez-Gonzalez, Andrea, MD       PTA Medications: Prescriptions Prior to Admission  Medication Sig Dispense Refill Last Dose  . meloxicam (MOBIC) 7.5 MG tablet Take 7.5 mg by mouth 2 (two) times daily.   Past Week at Unknown time  . FLUoxetine (PROZAC) 20 MG capsule Take 1 capsule (20 mg total) by mouth daily.  3 08/01/2016    Patient Stressors: Financial difficulties Health problems Substance  abuse  Patient Strengths: Ability for insight Active sense of humor Average or above average intelligence Capable of independent living Communication skills  Treatment Modalities: Medication Management, Group therapy, Case management,  1 to 1 session with clinician, Psychoeducation, Recreational therapy.   Physician Treatment Plan for Primary Diagnosis: Severe recurrent major depression without psychotic features (HCC) Long Term Goal(s): Improvement in symptoms so as ready for discharge   Short Term Goals: Ability to identify changes in lifestyle to reduce recurrence of condition will improve Ability to demonstrate self-control will improve Ability to identify and develop effective coping behaviors will improve  Medication Management: Evaluate patient's response, side effects, and tolerance of medication regimen.  Therapeutic Interventions: 1 to 1 sessions, Unit Group sessions and Medication administration.  Evaluation of Outcomes: Progressing  Physician Treatment Plan for Secondary Diagnosis: Principal Problem:   Severe recurrent major depression without psychotic features (HCC) Active Problems:   Methamphetamine use disorder, severe (HCC)   COPD (chronic obstructive pulmonary disease) (HCC)   Cocaine use disorder, severe, in sustained remission (HCC)   Amphetamine use disorder, severe, in sustained remission (HCC)  Long Term Goal(s): Improvement in symptoms so as ready for discharge   Short Term Goals: Ability to identify changes in lifestyle to reduce recurrence of condition will improve Ability to demonstrate self-control will improve Ability to identify and develop effective coping behaviors will improve     Medication Management: Evaluate patient's response, side effects, and tolerance of medication regimen.  Therapeutic Interventions: 1 to 1 sessions, Unit Group sessions and Medication administration.  Evaluation of Outcomes: Progressing   RN Treatment Plan for  Primary Diagnosis: Severe recurrent major depression without psychotic features (HCC) Long Term Goal(s): Knowledge of disease and therapeutic regimen to maintain health will improve  Short Term Goals: Ability to demonstrate self-control, Ability to disclose and discuss suicidal ideas and Compliance with prescribed medications will improve  Medication Management: RN will administer medications as ordered by provider, will assess and evaluate patient's response and provide education to patient for prescribed medication. RN will report any adverse and/or side effects to prescribing provider.  Therapeutic Interventions: 1 on 1 counseling sessions, Psychoeducation, Medication administration, Evaluate responses to treatment, Monitor vital signs and CBGs as ordered, Perform/monitor CIWA, COWS, AIMS and Fall Risk screenings as ordered, Perform wound care treatments as ordered.  Evaluation of Outcomes: Progressing   LCSW Treatment Plan for Primary Diagnosis: Severe recurrent major depression without psychotic features (HCC) Long Term Goal(s): Safe transition to appropriate next level of care at discharge, Engage patient in therapeutic group addressing interpersonal concerns.  Short Term Goals: Engage patient in aftercare planning with referrals and resources, Increase social support and Identify triggers associated with mental health/substance abuse issues  Therapeutic Interventions: Assess for all discharge needs, 1 to 1 time with Social worker, Explore available resources and support systems, Assess for adequacy in community support network, Educate family and significant other(s) on suicide prevention, Complete Psychosocial Assessment, Interpersonal group therapy.  Evaluation of Outcomes: Progressing   Progress in Treatment: Attending groups: Yes. Participating in groups: Yes. Taking medication as prescribed: Yes. Toleration medication: Yes. Family/Significant other contact made: Yes,  individual(s) contacted:  CSW attempted to contact pt's mother. Patient understands diagnosis: Yes. Discussing patient identified problems/goals with staff: Yes. Medical problems stabilized or resolved: Yes. Denies suicidal/homicidal ideation: Yes. Issues/concerns per patient self-inventory: No.  New problem(s) identified: No, Describe:  None  New Short Term/Long Term Goal(s): Patient stated that his goal is to get on medications that help with depression.  Discharge Plan or Barriers: Patient will discharge home and follo-up with outpatient providers.  Reason for Continuation of Hospitalization: Depression Suicidal ideation Other; describe Substance abuse  Estimated Length of Stay: 3 days   Attendees: Patient: Richard Harrington  08/03/2016 12:06 PM  Physician: Dr. Radene Journey, MD  08/03/2016 12:06 PM  Nursing:  08/03/2016 12:06 PM  RN Care Manager: 08/03/2016 12:06 PM  Social Worker: Hampton Abbot, MSW, LCSW-A 08/03/2016 12:06 PM  Recreational Therapist:  08/03/2016 12:06 PM  Other: Dr. Fleet Contras  08/03/2016 12:06 PM  Other: Jeanann Lewandowsky, PA Student  08/03/2016 12:06 PM  Other: 08/03/2016 12:06 PM    Scribe for Treatment Team: Lynden Oxford, LCSWA 08/03/2016 12:06 PM

## 2016-08-03 NOTE — Plan of Care (Signed)
Problem: Safety: Goal: Ability to disclose and discuss suicidal ideas will improve Outcome: Progressing Patient currently denies any SI and contracts to come to staff if he has any feelings of SI

## 2016-08-03 NOTE — [facility] Suicide Risk Assessment (Signed)
[facility] INPATIENT:  Family/Significant Other Suicide Prevention Education  Suicide Prevention Education:  Contact Attempts: mother, Judith BlonderLinda Bailey ph#: [phone] has been identified by the patient as the family member/significant other with whom the patient will be residing, and identified as the person(s) who will aid the patient in the event of a mental health crisis.  With written consent from the patient, two attempts were made to provide suicide prevention education, prior to and/or following the patient's discharge.  We were unsuccessful in providing suicide prevention education.  A suicide education pamphlet was given to the patient to share with family/significant other.  Date and time of first attempt: 08/03/2016; 11:54AM   Lynden OxfordKadijah R Areli Jowett, MSW, LCSW-A 08/03/2016, 11:56 AM

## 2016-08-03 NOTE — Progress Notes (Signed)
Patient was visible on the unit at times sitting in the dayroom. Patient was compliant with medication, meals, and snack. Patient denies any SI/HI/AH/VH, but feels his thoughts are cloudy. Patient states he is not sure if it is the medication or not, but his thoughts appear cloudy. Patient will respond to questions, but do not want to really answer the questions. Patient is alert and oriented x 4, breathing unlabored, and extremities x 4 within normal limits. Patient is calm and cooperative. Patient did not display any disruptive behavior. Will continue to monitor patient and notify MD of any changes.

## 2016-08-03 NOTE — [facility] Suicide Risk Assessment (Signed)
[facility] INPATIENT:  Family/Significant Other Suicide Prevention Education  Suicide Prevention Education:  Education Completed; mother, Judith BlonderLinda Bailey ph#: [phone] has been identified by the patient as the family member/significant other with whom the patient will be residing, and identified as the person(s) who will aid the patient in the event of a mental health crisis (suicidal ideations/suicide attempt).  With written consent from the patient, the family member/significant other has been provided the following suicide prevention education, prior to the and/or following the discharge of the patient.  The suicide prevention education provided includes the following:  Suicide risk factors  Suicide prevention and interventions  National Suicide Hotline telephone number  [facility] assessment telephone number  [facility] Assistance 911  [facility] and/or [facility]idential [facility] Crisis [facility] telephone number  Request made of family/significant other to:  Remove weapons (e.g., guns, rifles, knives), all items previously/currently identified as safety concern.    Remove drugs/medications (over-the-counter, prescriptions, illicit drugs), all items previously/currently identified as a safety concern.  The family member/significant other verbalizes understanding of the suicide prevention education information provided.  The family member/significant other agrees to remove the items of safety concern listed above.  Lynden OxfordKadijah R Darnell Jeschke, MSW, LCSW-A 08/03/2016, 1:38 PM

## 2016-08-03 NOTE — Progress Notes (Signed)
Pt appeared to sleep about 8 hours while monitored on 15 minute safety checks.   

## 2016-08-04 NOTE — Plan of Care (Signed)
Problem: Physical Regulation: Goal: Will remain free from infection Outcome: Progressing Instructed patient to keep chest tube dressing clean, dry and intact

## 2016-08-04 NOTE — [facility] Group Notes (Signed)
[facility] LCSW Group Therapy  08/04/2016 1:54 PM  Type of Therapy:  Group Therapy  Participation Level:  Active  Participation Quality:  Appropriate  Affect:  Appropriate  Cognitive:  Alert  Insight:  Developing/Improving  Engagement in Therapy:  Developing/Improving  Modes of Intervention:  Discussion, Education, Problem-solving, Reality Testing, Socialization and Support  Summary of Progress/Problems: Coping Skills: Patients defined and discussed healthy coping skills. Patients identified healthy coping skills they would like to try during hospitalization and after discharge. CSW offered insight to varying coping skills that may have been new to patients such as practicing mindfulness.  Madalin Hughart G. Garnette CzechSampson MSW, LCSWA 08/04/2016, 1:54 PM

## 2016-08-04 NOTE — Progress Notes (Signed)
[facility] MD Progress Note  08/04/2016 9:50 AM Richard Harrington  MRN:  [id number] Subjective:  [age] year old divorced Caucasian male. Who presented to our emergency department via EMS on June 17 after an overdose. The patient has a history of addiction to cocaine and methamphetamine. He had been sober for the last 8 months. For the last month the patient started developing symptoms of depression. He became suicidal after relapsing just a few days ago the patient decided to overdose on methamphetamines and tizanidine.  While in the emergency department he was found to have a pneumothorax. He was admitted to the medical floor and transferred to our unit yesterday.  Today the patient reports having is still depressed mood but no longer having suicidality. He denies homicidality or auditory or visual hallucinations. Denies any symptoms consistent with mania or hypomania. He denies any current stressors. He is unemployed. He has 3 children that he gets to see often. He has a supportive family.  6/22 Patient is feeling better today, slept well and ate well. Patent is also attending groups. Although he complains of feeling cloudy which he thinks is caused by the Prozac.  Patient denies suicidality, homicidality or hallucinations.  6/23 patient reports he is doing well. Denies suicidality, homicidality or auditory or visual hallucinations. Mood still irritable and dysphoric. He is compliant with medications. He denies major side effects today. He is slept better last night. His participation in programming is good. No events overnight.  Per Nurse Patient was visible on the unit at times sitting in the dayroom. Patient was compliant with medication, meals, and snack. Patient denies any SI/HI/AH/VH, but feels his thoughts are cloudy. Patient states he is not sure if it is the medication or not, but his thoughts appear cloudy. Patient will respond to questions, but do not want to really answer the questions.  Patient is alert and oriented x 4, breathing unlabored, and extremities x 4 within normal limits. Patient is calm and cooperative. Patient did not display any disruptive behavior. Will continue to monitor patient and notify MD of any changes.  Principal Problem: Severe recurrent major depression without psychotic features (HCC) Diagnosis:   Patient Active Problem List   Diagnosis Date Noted  . 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 COPD (chronic obstructive pulmonary disease) (HCC) [J44.9] 08/02/2016  . Cocaine use disorder, severe, in sustained remission (HCC) [F14.21] 08/02/2016  . 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 Severe recurrent major depression without psychotic features (HCC) [F33.2] 08/01/2016  . Inguinal hernia [K40.90]       Trauma history denies  Substance abuse history patient has been struggling with methamphetamine and cocaine addiction for the last 9 years. Prior to that he was abusing amphetamines and cocaine. Claims that he has not used it from his cocaine in several years. Denies the use of any other illicit substances or alcohol.Patient had a recent overdose on meth for which she was treated at [facility]. He was [facility] and from [facility] went [facility] for residential treatment    Past Psychiatric History: No prior psychiatric hospitalizations. Denies any history of suicidal attempts. Not following up outpatient with provider. Recently [facility] after [facility] days of treatment  Past Medical History:  Patient reports having a history of a seizure after overdosing on methamphetamine. He also has been diagnosed recently with COPD. He has inguinal hernia and is scheduled for surgery on July 30 Past Medical  History:  Diagnosis Date  . Depression   . Inguinal hernia    left  . Pneumohemothorax   . Substance abuse     Past Surgical History:   Procedure Laterality Date  . INCISION AND DRAINAGE ABSCESS     tonsil abscess   Family History:  Family History  Problem Relation Age of Onset  . Colon cancer Neg Hx   . Breast cancer Neg Hx    Family Psychiatric  History:  Does not know of any family history of mental illness  Social History:  Patient is divorced and currently lives alone. He has three children that he sees occasionally. His highest level of education is 10th grade. He works at night as a Counsellor. History  Alcohol Use No    Comment: occasional     History  Drug Use  . Frequency: 7.0 times per week  . Types: Methamphetamines    Social History   Social History  . Marital status: Single    Spouse name: N/A  . Number of children: N/A  . Years of education: N/A   Social History Main Topics  . Smoking status: Former Smoker    Packs/day: 0.50    Years: 24.00    Types: Cigarettes  . Smokeless tobacco: Never Used  . Alcohol use No     Comment: occasional  . Drug use: Yes    Frequency: 7.0 times per week    Types: Methamphetamines  . Sexual activity: Not Asked   Other Topics Concern  . None   Social History Narrative  . None   Additional Social History:    History of alcohol / drug use?: Yes Longest period of sobriety (when/how long): 8 months Negative Consequences of Use: Personal relationships, Financial, Legal Name of Substance 1: meth       Current Medications: Current Facility-Administered Medications  Medication Dose Route Frequency Provider Last Rate Last Dose  . acetaminophen (TYLENOL) tablet 650 mg  650 mg Oral Q6H PRN Clapacs, John T, MD      . albuterol (PROVENTIL HFA;VENTOLIN HFA) 108 (90 Base) MCG/ACT inhaler 1-2 puff  1-2 puff Inhalation Q4H PRN Jimmy Footman, MD      . alum & mag hydroxide-simeth (MAALOX/MYLANTA) 200-200-20 MG/5ML suspension 30 mL  30 mL Oral Q4H PRN Clapacs, John T, MD      . FLUoxetine (PROZAC) capsule 20 mg  20 mg Oral Daily Clapacs, Jackquline Denmark,  MD   20 mg at 08/04/16 0805  . hydrOXYzine (ATARAX/VISTARIL) tablet 25 mg  25 mg Oral TID PRN Clapacs, Jackquline Denmark, MD      . ibuprofen (ADVIL,MOTRIN) tablet 600 mg  600 mg Oral Q6H PRN Clapacs, John T, MD      . magnesium hydroxide (MILK OF MAGNESIA) suspension 30 mL  30 mL Oral Daily PRN Jimmy Footman, MD      . traZODone (DESYREL) tablet 50 mg  50 mg Oral QHS PRN Jimmy Footman, MD        Lab Results: No results found for this or any previous visit (from the past 48 hour(s)).  Blood Alcohol level:  Lab Results  Component Value Date   ETH <5 07/29/2016    Metabolic Disorder Labs: No results found for: HGBA1C, MPG No results found for: PROLACTIN No results found for: CHOL, TRIG, HDL, CHOLHDL, VLDL, LDLCALC  Physical Findings: AIMS: Facial and Oral Movements Muscles of Facial Expression: None, normal Lips and Perioral Area: None, normal Jaw: None, normal Tongue: None, normal,Extremity Movements Upper (  arms, wrists, hands, fingers): None, normal Lower (legs, knees, ankles, toes): None, normal, Trunk Movements Neck, shoulders, hips: None, normal, Overall Severity Severity of abnormal movements (highest score from questions above): None, normal Incapacitation due to abnormal movements: None, normal Patient's awareness of abnormal movements (rate only patient's report): No Awareness, Dental Status Current problems with teeth and/or dentures?: No Does patient usually wear dentures?: No  CIWA:  CIWA-Ar Total: 0 COWS:     Musculoskeletal: Strength & Muscle Tone: within normal limits Gait & Station: normal Patient leans: N/A  Psychiatric Specialty Exam: Physical Exam  Constitutional: He is oriented to person, place, and time. He appears well-developed and well-nourished.  HENT:  Head: Normocephalic and atraumatic.  Neck: Normal range of motion.  Respiratory: Effort normal.  Neurological: He is oriented to person, place, and time.    Review of Systems   Constitutional: Negative.   HENT: Negative.   Eyes: Negative.   Respiratory: Negative.   Cardiovascular: Negative.   Gastrointestinal: Negative.   Genitourinary: Negative.   Musculoskeletal: Negative.   Skin: Negative.   Neurological: Negative.   Endo/Heme/Allergies: Negative.   Psychiatric/Behavioral: Positive for depression and substance abuse. Negative for hallucinations, memory loss and suicidal ideas. The patient is not nervous/anxious and does not have insomnia.     Blood pressure 106/76, pulse 85, temperature 98 F (36.7 C), temperature source Oral, resp. rate 18, height 5\' 9"  (1.753 m), weight 55.3 kg (122 lb), SpO2 99 %.Body mass index is 18.02 kg/m.  General Appearance: Well Groomed  Eye Contact:  Good  Speech:  Clear and Coherent  Volume:  Normal  Mood:  Dysphoric  Affect:  Appropriate and Congruent  Thought Process:  Linear and Descriptions of Associations: Intact  Orientation:  Full (Time, Place, and Person)  Thought Content:  Hallucinations: None  Suicidal Thoughts:  No  Homicidal Thoughts:  No  Memory:  Immediate;   Good Recent;   Good Remote;   Good  Judgement:  Fair  Insight:  Fair  Psychomotor Activity:  Normal  Concentration:  Concentration: Good and Attention Span: Good  Recall:  Good  Fund of Knowledge:  Good  Language:  Good  Akathisia:  No  Handed:    AIMS (if indicated):     Assets:  Communication Skills Physical Health  ADL's:  Intact  Cognition:  WNL  Sleep:  Number of Hours: 6.45     Treatment Plan Summary:  Patient is a 46 year old Caucasian male with a history of severe cocaine and methamphetamine dependence. Patient has been struggling to stay sober. He relapsed recently after being clean for 8 months. He became suicidal and decided to overdose on methamphetamine and a muscle relaxant.  Major depressive disorder will continue fluoxetine 20 mg a day  Insomnia: Continue trazodone 50 mg by mouth daily at bedtime when  necessary  Methamphetamine addiction: Working on getting patient I to [facility]. Patient also advised to follow up with a support group such as NA or AA  Cocaine and amphetamine use disorder: Patient stated that this has been in remission for many years. Plan to refer patient for intensive care  COPD continue albuterol inhaler as needed--- no evidence of shortness of breath  Diet regular  Precautions every 15 minute checks  Hospitalization  voluntary  Vital signs daily  Dispo: Discharge back home once stable--- likely early next week  Follow-up to be determined--[facility] or [facility]  Labs: TSH and B12 are normal.    Jimmy FootmanHernandez-Gonzalez,  Marvina Danner, MD 08/04/2016, 9:50 AM

## 2016-08-04 NOTE — Plan of Care (Signed)
Problem: Coping: Goal: Ability to cope will improve Outcome: Progressing Patient spent most of this evening in the dayroom with peers.  He was pleasant on contact but denied needing assistance.  He denies SI/HI/AV/H and contracts for safety.

## 2016-08-04 NOTE — Progress Notes (Signed)
Pt calm and cooperative but guarded.  During assessment, reports mood as "alright."  Would not elaborate further.  Later in day, pt asked when his chest tube dressing could be removed.  Instructed pt to leave dressing in place until MD instructs removal.  Dressing visualized, clean, dry and intact.  No behavioral issues.

## 2016-08-05 MED ORDER — ALBUTEROL SULFATE HFA 108 (90 BASE) MCG/ACT IN AERS: 1.0000 | INHALATION_SPRAY | RESPIRATORY_TRACT | 0 refills | Status: AC | PRN

## 2016-08-05 MED ORDER — TRAZODONE HCL 50 MG PO TABS: 50.0000 mg | ORAL_TABLET | Freq: Every evening | ORAL | 0 refills | Status: DC | PRN

## 2016-08-05 MED ORDER — FLUOXETINE HCL 20 MG PO CAPS: 20.0000 mg | ORAL_CAPSULE | Freq: Every day | ORAL | 0 refills | Status: DC

## 2016-08-05 NOTE — Plan of Care (Signed)
Problem: Health Behavior/Discharge Planning: Goal: Compliance with therapeutic regimen will improve Outcome: Progressing Compliant with med regimen.

## 2016-08-05 NOTE — Discharge Summary (Signed)
Physician Discharge Summary Note  Patient:  Jocelyn Lowery is an 46 y.o., male MRN:  [id number] DOB:  Jan 25, 1971 Patient phone:  [phone] (home)  Patient address:   [address] [place] [address],  Total Time spent with patient: 30 minutes  Date of Admission:  08/01/2016 Date of Discharge: 08/06/16  Reason for Admission:  SI  Principal Problem: Severe recurrent major depression without psychotic features [facility]) Discharge Diagnoses: Patient Active Problem List   Diagnosis Date Noted  . Methamphetamine use disorder, severe ([place]) [F15.20] 08/02/2016  . COPD (chronic obstructive pulmonary disease) ([place]) [J44.9] 08/02/2016  . Cocaine use disorder, severe, in sustained remission ([place]) [F14.21] 08/02/2016  . Amphetamine use disorder, severe, in sustained remission ([place]) [F15.21] 08/02/2016  . Severe recurrent major depression without psychotic features ([place]) [F33.2] 08/01/2016  . Inguinal hernia [K40.90]     History of Present Illness:   Mr. Messiyah Waterson is a 46 yo caucasian male who was admitted to the medical unit a few days ago for a methamphetamine and muscle relaxer overdose with suicidal intention, and a past history of meth abuse.  He reports that for the past 1-2  mnths he has been feeling depressed, and felt like that he no longer wanted to live which lead to the attempted suicide.  He has no prior suicide attempts. He had been clean for almost 8 months prior to this admission.   About 8 months ago he had unintentionally OD and was hospitalized. From there,  he was sent to [facility] in [place], who then referred him to [facility] where he stayed for 55 days.  Upon discharge from rehab, he continued to attend NA which he said was very helpful.  Unfortunately, he said that he had to stop attending the meetings due to lack of time, which he feels played a role in his depression and relapse.  He does not see a counselor or a psychiatrist. Pt reports that he was "eating"  meth everyday before he went to rehab, and has a past history of cocaine, adderall and marijuana abuse.  He also admits to having a seizure that was related to meth use, but does not remember how long ago that was. He denies traumatic events or stressors, head trauma, homicidal ideations, symptoms of mania/hypomania, delusions, or hallucinations.  Associated Signs/Symptoms: Depression Symptoms:  depressed mood, suicidal attempt, (Hypo) Manic Symptoms:  none present Anxiety Symptoms:  none present Psychotic Symptoms:  none present PTSD Symptoms: none present Total Time spent with patient: 45 minutes  Past Psychiatric History: Pt has a history of substance abuse and addiction, and has OD multiple times.  Before rehab and relapse, Most recent addiction to methamphetamine started about 9 years ago and he used everyday.  As mentioned in HPI, he has abused cocaine, adderall, and marijuana in the past.    Past Medical History:  Past Medical History:  Diagnosis Date  . Depression   . Inguinal hernia    left  . Pneumohemothorax   . Substance abuse     Past Surgical History:  Procedure Laterality Date  . INCISION AND DRAINAGE ABSCESS     tonsil abscess   Family History:  Family History  Problem Relation Age of Onset  . Colon cancer Neg Hx   . Breast cancer Neg Hx    Family Psychiatric  History: No pertinent family history   Social History: Pt is divorced and has three children ages 54,14 and 65 whom he gets to see on occasion.  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  He has a 10th grade education.  He used to smoke about a pack a day, but quit after being diagnosed with COPD about 2 weeks ago.  History  Alcohol Use No    Comment: occasional     History  Drug Use  . Frequency: 7.0 times per week  . Types: Methamphetamines    Social History   Social History  . Marital status: Single    Spouse name: N/A  . Number of  children: N/A  . Years of education: N/A   Social History Main Topics  . Smoking status: Former Smoker    Packs/day: 0.50    Years: 24.00    Types: Cigarettes  . Smokeless tobacco: Never Used  . Alcohol use No     Comment: occasional  . Drug use: Yes    Frequency: 7.0 times per week    Types: Methamphetamines  . Sexual activity: Not Asked   Other Topics Concern  . None   Social History Narrative  . None    Hospital Course:    Patient is a 46 year old Caucasian male with a history of severe cocaine and methamphetamine dependence. Patient has been struggling to stay sober. He relapsed recently after being clean for 8 months. He became suicidal and decided to overdose on methamphetamine and a muscle relaxant.  Major depressive disorder: continue fluoxetine 20 mg a day  Insomnia: Continue trazodone 50 mg by mouth daily at bedtime when necessary  Methamphetamine addiction: Working on getting patient I to [facility]. Patient also advised to follow up with a support group such as NA or AA  Cocaine and amphetamine use disorder: Patient stated that this has been in remission for many years. Plan to refer patient for intensive care  COPD continue albuterol inhaler as needed--- no evidence of shortness of breath   Dispo:Discharge back home   Follow-up to be determined--[facility]   Labs: TSH and B12 are normal.   This hospitalization was uneventful. The patient did not display any unsafe or disruptive behaviors. He participated actively in programming. He did not display aggression, agitation. He was cooperative and calm.  Today he denies suicidality, hopelessness, helplessness. He denies any symptoms consistent with psychosis, mania or hypomania. He is tolerating medications well. He denies any side effects. He denies having any access to guns.  Staff working with the patient feels he is stable, much improved and ready for discharge. They do not have any concerns about his safety  or the safety of others upon discharge. Family (mother) also has been contacted and they have not voiced any concerns about discharge.  FMLA paperwork will be completed. Patient will be cleared to return to work on June 30   Physical Findings: AIMS: Facial and Oral Movements Muscles of Facial Expression: None, normal Lips and Perioral Area: None, normal Jaw: None, normal Tongue: None, normal,Extremity Movements Upper (arms, wrists, hands, fingers): None, normal Lower (legs, knees, ankles, toes): None, normal, Trunk Movements Neck, shoulders, hips: None, normal, Overall Severity Severity of abnormal movements (highest score from questions above): None, normal Incapacitation due to abnormal movements: None, normal Patient's awareness of abnormal movements (rate only patient's report): No Awareness, Dental Status Current problems with teeth and/or dentures?: No Does patient usually wear dentures?: No  CIWA:  CIWA-Ar Total: 0 COWS:     Musculoskeletal: Strength & Muscle Tone: within normal limits Gait & Station: normal Patient  leans: N/A  Psychiatric Specialty Exam: Physical Exam  Constitutional: He is oriented to person, place, and time. He appears well-developed and well-nourished.  HENT:  Head: Normocephalic and atraumatic.  Eyes: Conjunctivae and EOM are normal.  Neck: Normal range of motion.  Respiratory: Effort normal.  Musculoskeletal: Normal range of motion.  Neurological: He is alert and oriented to person, place, and time.    Review of Systems  Constitutional: Negative.   HENT: Negative.   Eyes: Negative.   Respiratory: Negative.   Cardiovascular: Negative.   Gastrointestinal: Negative.   Genitourinary: Negative.   Musculoskeletal: Negative.   Skin: Negative.   Neurological: Negative.   Endo/Heme/Allergies: Negative.   Psychiatric/Behavioral: Positive for depression and substance abuse. Negative for hallucinations, memory loss and suicidal ideas. The patient is  not nervous/anxious and does not have insomnia.     Blood pressure 105/70, pulse 81, temperature 97.8 F (36.6 C), temperature source Oral, resp. rate 18, height 5' 9" (1.753 m), weight 55.3 kg (122 lb), SpO2 99 %.Body mass index is 18.02 kg/m.  General Appearance: Well Groomed  Eye Contact:  Good  Speech:  Clear and Coherent  Volume:  Normal  Mood:  Euthymic  Affect:  Appropriate and Congruent  Thought Process:  Linear and Descriptions of Associations: Intact  Orientation:  Full (Time, Place, and Person)  Thought Content:  Hallucinations: None  Suicidal Thoughts:  No  Homicidal Thoughts:  No  Memory:  Immediate;   Good Recent;   Good Remote;   Good  Judgement:  Fair  Insight:  Fair  Psychomotor Activity:  Normal  Concentration:  Concentration: Good and Attention Span: Good  Recall:  Good  Fund of Knowledge:  Good  Language:  Good  Akathisia:  No  Handed:    AIMS (if indicated):     Assets:  Communication Skills Social Support  ADL's:  Intact  Cognition:  WNL  Sleep:  Number of Hours: 7     Have you used any form of tobacco in the last 30 days? (Cigarettes, Smokeless Tobacco, Cigars, and/or Pipes): Yes  Has this patient used any form of tobacco in the last 30 days? (Cigarettes, Smokeless Tobacco, Cigars, and/or Pipes) Yes, Yes, A prescription for an FDA-approved tobacco cessation medication was offered at discharge and the patient refused  Blood Alcohol level:  Lab Results  Component Value Date   ETH <5 07/29/2016    Metabolic Disorder Labs:  No results found for: HGBA1C, MPG No results found for: PROLACTIN No results found for: CHOL, TRIG, HDL, CHOLHDL, VLDL, LDLCALC   Results for Clere, Vonn SHANE (MRN [id number]) as of 08/05/2016 13:55  Ref. Range 07/30/2016 08:54 07/30/2016 09:06 07/31/2016 07:43 07/31/2016 11:51 08/01/2016 06:29  BASIC METABOLIC PANEL Unknown Rpt (A)    Rpt (A)  Sodium Latest Ref Range: 135 - 145 mmol/L 133 (L)    138  Potassium Latest Ref  Range: 3.5 - 5.1 mmol/L 4.1    3.9  Chloride Latest Ref Range: 101 - 111 mmol/L 107    106  CO2 Latest Ref Range: 22 - 32 mmol/L 22    29  Glucose Latest Ref Range: 65 - 99 mg/dL 85    89  BUN Latest Ref Range: 6 - 20 mg/dL 26 (H)    10  Creatinine Latest Ref Range: 0.61 - 1.24 mg/dL 0.86    0.63  Calcium Latest Ref Range: 8.9 - 10.3 mg/dL 8.4 (L)    8.5 (L)  Anion gap Latest Ref Range: 5 -   15  4 (L)    3 (L)  EGFR (African American) Latest Ref Range: >60 mL/min >60    >60  EGFR (Non-African Amer.) Latest Ref Range: >60 mL/min >60    >60  Vitamin B12 Latest Ref Range: 180 - 914 pg/mL     346  WBC Latest Ref Range: 3.8 - 10.6 K/uL 7.4    5.3  RBC Latest Ref Range: 4.40 - 5.90 MIL/uL 4.70    4.30 (L)  Hemoglobin Latest Ref Range: 13.0 - 18.0 g/dL 14.0    12.9 (L)  HCT Latest Ref Range: 40.0 - 52.0 % 40.8    37.1 (L)  MCV Latest Ref Range: 80.0 - 100.0 fL 86.9    86.3  MCH Latest Ref Range: 26.0 - 34.0 pg 29.9    29.9  MCHC Latest Ref Range: 32.0 - 36.0 g/dL 34.4    34.6  RDW Latest Ref Range: 11.5 - 14.5 % 13.2    13.1  Platelets Latest Ref Range: 150 - 440 K/uL 169    139 (L)  TSH Latest Ref Range: 0.350 - 4.500 uIU/mL     1.456  HIV Latest Ref Range: Non Reactive  Non Reactive      DG CHEST PORT 1 VIEW Unknown  Rpt Rpt Rpt     See Psychiatric Specialty Exam and Suicide Risk Assessment completed by Attending Physician prior to discharge.  Discharge destination:  Home  Is patient on multiple antipsychotic therapies at discharge:  No   Has Patient had three or more failed trials of antipsychotic monotherapy by history:  No  Recommended Plan for Multiple Antipsychotic Therapies: NA   Allergies as of 08/06/2016   No Known Allergies     Medication List    STOP taking these medications   meloxicam 7.5 MG tablet Commonly known as:  MOBIC     TAKE these medications     Indication  albuterol 108 (90 Base) MCG/ACT inhaler Commonly known as:  PROVENTIL HFA;VENTOLIN HFA Inhale  1-2 puffs into the lungs every 4 (four) hours as needed for wheezing or shortness of breath.  Indication:  Disease Involving Spasms of the Bronchus   FLUoxetine 20 MG capsule Commonly known as:  PROZAC Take 1 capsule (20 mg total) by mouth daily.  Indication:  Major Depressive Disorder   traZODone 50 MG tablet Commonly known as:  DESYREL Take 1 tablet (50 mg total) by mouth at bedtime as needed for sleep.  Indication:  Trouble Sleeping      Follow-up Information    [facility] Follow up on 08/10/2016.   Why:  Dr. Genevive Bi at 10:30 f/u after chest tube removal Contact information: [place] [place] on 08/08/2016.   Why:  Please follow-up with [place] on Wednesday, June 27th at [facility] for your initial assessment. Please contact Sherrian Divers with questions @ [phone] Contact information: [place] [address] [phone]           >30 minutes. >50 % of the time was spent incoordination of care.  Signed: Hildred Priest, MD 08/06/2016, 12:19 PM

## 2016-08-05 NOTE — [facility] Group Notes (Signed)
[facility] LCSW Group Therapy  08/05/2016 2:37 PM  Type of Therapy:  Group Therapy  Participation Level:  Active  Participation Quality:  Attentive  Affect:  Appropriate  Cognitive:  Alert  Insight:  Developing/Improving  Engagement in Therapy:  Developing/Improving  Modes of Intervention:  Activity, Discussion, Education, Problem-solving, Reality Testing, Socialization and Support  Summary of Progress/Problems: Communications: Patients identify how individuals communicate with one another appropriately and inappropriately. Patients will be guided to discuss their thoughts, feelings, and behaviors related to barriers when communicating. The group will process together ways to execute positive and appropriate communications.   Danelle Curiale G. Garnette CzechSampson MSW, LCSWA 08/05/2016, 2:37 PM

## 2016-08-05 NOTE — Progress Notes (Addendum)
[facility] MD Progress Note  08/05/2016 12:13 PM Richard Harrington  MRN:  [id number] Subjective:  36/55 46 year old divorced Caucasian male. Who presented to our emergency department via EMS on June 17 after an overdose. The patient has a history of addiction to cocaine and methamphetamine. He had been sober for the last 8 months. For the last month the patient started developing symptoms of depression. He became suicidal after relapsing just a few days ago the patient decided to overdose on methamphetamines and tizanidine.  While in the emergency department he was found to have a pneumothorax. He was admitted to the medical floor and transferred to our unit yesterday.  Today the patient reports having is still depressed mood but no longer having suicidality. He denies homicidality or auditory or visual hallucinations. Denies any symptoms consistent with mania or hypomania. He denies any current stressors. He is unemployed. He has 3 children that he gets to see often. He has a supportive family.  6/22 Patient is feeling better today, slept well and ate well. Patent is also attending groups. Although he complains of feeling cloudy which he thinks is caused by the Prozac.  Patient denies suicidality, homicidality or hallucinations.  6/23 patient reports he is doing well. Denies suicidality, homicidality or auditory or visual hallucinations. Mood still irritable and dysphoric. He is compliant with medications. He denies major side effects today. He is slept better last night. His participation in programming is good. No events overnight.  6/24 patient reports doing well. He denies having any issues or concerns. Says that his mood is much improved, denies problems with sleep, appetite, energy or concentration. Denies suicidality, hopelessness, helplessness or homicidal ideation. He has been tolerating well his medications. He denies any side effects or physical complaints. He has been participating in  groups. He has not displayed any disruptive or unsafe behaviors  Per Nurse Patient was visible in the milieu He interacted well with peers and staff. Pt was cooperative with treatment.Patient denies any SI/HI/AH/VH. Patient is calm and cooperative. Patient did not have any behavioral issues. Patient continues to be monitored on 15 minute safety checks. He appears to be resting in bed quietly at this time, no issues to report on shift at this time.  Principal Problem: Severe recurrent major depression without psychotic features (HCC) Diagnosis:   Patient Active Problem List   Diagnosis Date Noted  . 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 COPD (chronic obstructive pulmonary disease) (HCC) [J44.9] 08/02/2016  . Cocaine use disorder, severe, in sustained remission (HCC) [F14.21] 08/02/2016  . 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 Severe recurrent major depression without psychotic features (HCC) [F33.2] 08/01/2016  . Inguinal hernia [K40.90]       Trauma history denies  Substance abuse history patient has been struggling with methamphetamine and cocaine addiction for the last 9 years. Prior to that he was abusing amphetamines and cocaine. Claims that he has not used it from his cocaine in several years. Denies the use of any other illicit substances or alcohol.Patient had a recent overdose on meth for which she was treated at [facility]. He was [facility] and from [facility] for residential treatment    Past Psychiatric History: No prior psychiatric hospitalizations. Denies any history of suicidal attempts. Not following up outpatient with provider. Recently discharged [facility] after 55 days of treatment  Past Medical History:  Patient reports having a history of a  seizure after overdosing on methamphetamine. He also has been diagnosed recently with COPD. He has  inguinal hernia and is scheduled for surgery on July 30 Past Medical History:  Diagnosis Date  . Depression   . Inguinal hernia    left  . Pneumohemothorax   . Substance abuse     Past Surgical History:  Procedure Laterality Date  . INCISION AND DRAINAGE ABSCESS     tonsil abscess   Family History:  Family History  Problem Relation Age of Onset  . Colon cancer Neg Hx   . Breast cancer Neg Hx    Family Psychiatric  History:  Does not know of any family history of mental illness  Social History:  Patient is divorced and currently lives alone. He has three children that he sees occasionally. His highest level of education is 10th grade. He works at night as a Counsellorprinter. History  Alcohol Use No    Comment: occasional     History  Drug Use  . Frequency: 7.0 times per week  . Types: Methamphetamines    Social History   Social History  . Marital status: Single    Spouse name: N/A  . Number of children: N/A  . Years of education: N/A   Social History Main Topics  . Smoking status: Former Smoker    Packs/day: 0.50    Years: 24.00    Types: Cigarettes  . Smokeless tobacco: Never Used  . Alcohol use No     Comment: occasional  . Drug use: Yes    Frequency: 7.0 times per week    Types: Methamphetamines  . Sexual activity: Not Asked   Other Topics Concern  . None   Social History Narrative  . None   Additional Social History:    History of alcohol / drug use?: Yes Longest period of sobriety (when/how long): 8 months Negative Consequences of Use: Personal relationships, Financial, Legal Name of Substance 1: meth       Current Medications: Current Facility-Administered Medications  Medication Dose Route Frequency Provider Last Rate Last Dose  . acetaminophen (TYLENOL) tablet 650 mg  650 mg Oral Q6H PRN Clapacs, John T, MD      . albuterol (PROVENTIL HFA;VENTOLIN HFA) 108 (90 Base) MCG/ACT inhaler 1-2 puff  1-2 puff Inhalation Q4H PRN Jimmy FootmanHernandez-Gonzalez,  Cletus Paris, MD      . alum & mag hydroxide-simeth (MAALOX/MYLANTA) 200-200-20 MG/5ML suspension 30 mL  30 mL Oral Q4H PRN Clapacs, John T, MD      . FLUoxetine (PROZAC) capsule 20 mg  20 mg Oral Daily Clapacs, Jackquline DenmarkJohn T, MD   20 mg at 08/05/16 [id number]  . hydrOXYzine (ATARAX/VISTARIL) tablet 25 mg  25 mg Oral TID PRN Clapacs, Jackquline DenmarkJohn T, MD      . ibuprofen (ADVIL,MOTRIN) tablet 600 mg  600 mg Oral Q6H PRN Clapacs, John T, MD      . magnesium hydroxide (MILK OF MAGNESIA) suspension 30 mL  30 mL Oral Daily PRN Jimmy FootmanHernandez-Gonzalez, Jimeka Balan, MD      . traZODone (DESYREL) tablet 50 mg  50 mg Oral QHS PRN Jimmy FootmanHernandez-Gonzalez, Trimaine Maser, MD        Lab Results: No results found for this or any previous visit (from the past 48 hour(s)).  Blood Alcohol level:  Lab Results  Component Value Date   ETH <5 07/29/2016    Metabolic Disorder Labs: No results found for: HGBA1C, MPG No results found for: PROLACTIN No results found for: CHOL, TRIG, HDL, CHOLHDL, VLDL, LDLCALC  Physical Findings: AIMS: Facial and Oral Movements Muscles of Facial Expression: None, normal Lips and Perioral Area: None, normal Jaw: None, normal Tongue: None, normal,Extremity Movements Upper (arms, wrists, hands, fingers): None, normal Lower (legs, knees, ankles, toes): None, normal, Trunk Movements Neck, shoulders, hips: None, normal, Overall Severity Severity of abnormal movements (highest score from questions above): None, normal Incapacitation due to abnormal movements: None, normal Patient's awareness of abnormal movements (rate only patient's report): No Awareness, Dental Status Current problems with teeth and/or dentures?: No Does patient usually wear dentures?: No  CIWA:  CIWA-Ar Total: 0 COWS:     Musculoskeletal: Strength & Muscle Tone: within normal limits Gait & Station: normal Patient leans: N/A  Psychiatric Specialty Exam: Physical Exam  Constitutional: He is oriented to person, place, and time. He appears  well-developed and well-nourished.  HENT:  Head: Normocephalic and atraumatic.  Neck: Normal range of motion.  Respiratory: Effort normal.  Neurological: He is oriented to person, place, and time.    Review of Systems  Constitutional: Negative.   HENT: Negative.   Eyes: Negative.   Respiratory: Negative.   Cardiovascular: Negative.   Gastrointestinal: Negative.   Genitourinary: Negative.   Musculoskeletal: Negative.   Skin: Negative.   Neurological: Negative.   Endo/Heme/Allergies: Negative.   Psychiatric/Behavioral: Positive for depression and substance abuse. Negative for hallucinations, memory loss and suicidal ideas. The patient is not nervous/anxious and does not have insomnia.     Blood pressure 107/76, pulse 91, temperature 98.1 F (36.7 C), temperature source Oral, resp. rate 18, height 5\' 9"  (1.753 m), weight 55.3 kg (122 lb), SpO2 99 %.Body mass index is 18.02 kg/m.  General Appearance: Well Groomed  Eye Contact:  Good  Speech:  Clear and Coherent  Volume:  Normal  Mood:  Dysphoric  Affect:  Appropriate and Congruent  Thought Process:  Linear and Descriptions of Associations: Intact  Orientation:  Full (Time, Place, and Person)  Thought Content:  Hallucinations: None  Suicidal Thoughts:  No  Homicidal Thoughts:  No  Memory:  Immediate;   Good Recent;   Good Remote;   Good  Judgement:  Fair  Insight:  Fair  Psychomotor Activity:  Normal  Concentration:  Concentration: Good and Attention Span: Good  Recall:  Good  Fund of Knowledge:  Good  Language:  Good  Akathisia:  No  Handed:    AIMS (if indicated):     Assets:  Communication Skills Physical Health  ADL's:  Intact  Cognition:  WNL  Sleep:  Number of Hours: 6.25     Treatment Plan Summary:  Patient is a 46 year old Caucasian male with a history of severe cocaine and methamphetamine dependence. Patient has been struggling to stay sober. He relapsed recently after being clean for 8 months. He became  suicidal and decided to overdose on methamphetamine and a muscle relaxant.  Major depressive disorder: continue fluoxetine 20 mg a day  Insomnia: Continue trazodone 50 mg by mouth daily at bedtime when necessary  Methamphetamine addiction: Working on getting patient I to [facility]. Patient also advised to follow up with a support group such as NA or AA  Cocaine and amphetamine use disorder: Patient stated that this has been in remission for many years. Plan to refer patient for intensive care  COPD continue albuterol inhaler as needed--- no evidence of shortness of breath  Diet regular  Precautions every 15 minute checks  Hospitalization  voluntary  Vital signs daily  Dispo: Discharge back home once stable--- likely early next week  Follow-up to be determined--[facility] or [facility]  Labs: TSH and B12 are normal.  Likely will be d/c home tomorrow    Jimmy Footman, MD 08/05/2016, 12:13 PM

## 2016-08-05 NOTE — Progress Notes (Signed)
Patient was visible in the milieu  He interacted well with peers and staff. Pt was cooperative with treatment. Patient denies any SI/HI/AH/VH.  Patient is calm and cooperative. Patient did not have any behavioral issues. Patient continues to be monitored on 15 minute safety checks. He appears to be resting in bed quietly at this time, no issues to report on shift at this time.

## 2016-08-05 NOTE — Progress Notes (Signed)
Pt calm, cooperative and pleasant this morning.  Remains guarded, vague responses to assessment questions.  Reports he slept "fine" and mood is "alright."  Visible on unit, minimal interactions observed.  Med compliant.

## 2016-08-05 NOTE — [facility] Suicide Risk Assessment (Signed)
[facility]   Principal Problem: Severe recurrent major depression without psychotic features [facility](HCC) Discharge Diagnoses:  Patient Active Problem List   Diagnosis Date Noted  . 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 COPD (chronic obstructive pulmonary disease) (HCC) [J44.9] 08/02/2016  . Cocaine use disorder, severe, in sustained remission (HCC) [F14.21] 08/02/2016  . 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 Severe recurrent major depression without psychotic features (HCC) [F33.2] 08/01/2016  . Inguinal hernia [K40.90]        Psychiatric Specialty Exam: ROS  Blood pressure 107/76, pulse 91, temperature 98.1 F (36.7 C), temperature source Oral, resp. rate 18, height 5\' 9"  (1.753 m), weight 55.3 kg (122 lb), SpO2 99 %.Body mass index is 18.02 kg/m.                                                       Mental Status Per Nursing Assessment::   On Admission:     Demographic Factors:  Male, Divorced or widowed and Caucasian  Loss Factors: NA  Historical Factors: Impulsivity  Risk Reduction Factors:   Responsible for children under 44 years of age, Sense of responsibility to family, Employed and Positive social support  Denies access to guns  Continued Clinical Symptoms:  Alcohol/Substance Abuse/Dependencies  Cognitive Features That Contribute To Risk:  Polarized thinking    Suicide Risk:  Minimal: No identifiable suicidal ideation.  Patients presenting with no risk factors but with morbid ruminations; may be classified as minimal risk based on the severity of the depressive symptoms     Richard FootmanHernandez-Gonzalez,  [name], MD 08/05/2016, 1:52 PM

## 2016-08-05 NOTE — [facility] Group Notes (Signed)
[facility] Group Notes:  (Nursing/MHT/Case Management/Adjunct)  Date:  08/05/2016  Time:  10:41 PM  Type of Therapy:  Psychoeducational Skills  Participation Level:  Active  Participation Quality:  Appropriate and Attentive  Affect:  Appropriate  Cognitive:  Alert, Appropriate and Oriented  Insight:  Good  Engagement in Group:  Engaged  Modes of Intervention:  Discussion and Exploration  Summary of Progress/Problems:  Foy GuadalajaraJasmine R Aleksa Catterton 08/05/2016, 10:41 PM

## 2016-08-06 ENCOUNTER — Other Ambulatory Visit: Payer: Self-pay

## 2016-08-06 DIAGNOSIS — J9383 Other pneumothorax: Secondary | ICD-10-CM

## 2016-08-06 NOTE — Plan of Care (Signed)
Problem: Coping: Goal: Ability to cope will improve Outcome: Progressing Patient remains calm and pleasant on the unit.

## 2016-08-06 NOTE — Progress Notes (Signed)
Patient discharged on above date and time. Discharge information reviewed with patient with verbal feedback of understanding the instructions provided by the RN. Personal items along with prescriptions given to patient upon discharge. Patient picked up by his mother to go home and he plans to follow up with [facility]. No complaints of voiced. Denies SI.

## 2016-08-06 NOTE — Progress Notes (Signed)
  [facility] :  Will you be returning to the same living situation after discharge:  Yes,  returning home. At discharge, do you have transportation home?: Yes,  mother will pick pt up. Do you have the ability to pay for your medications: Yes,  BCBS.  Release of information consent forms completed and in the chart;  Patient's signature needed at discharge.  Patient to Follow up at: Follow-up Information    [facility] Follow up on 08/10/2016.   Why:  Dr. Thelma Bargeaks at 10:30 f/u after chest tube removal Contact information: [address] [place] [id number] [phone]       [facility] Go on 08/08/2016.   Why:  Please follow-up with [facility] on Wednesday, June 27th at [facility] for your initial assessment. Please contact Unk PintoHarvey Bryant with questions @ [phone] Contact information: [address] [place] [address] [phone]           Next level of care provider has access to [facility] Link:no  Safety Planning and Suicide Prevention discussed: Yes,  SPE completed with pt's mother.  Have you used any form of tobacco in the last 30 days? (Cigarettes, Smokeless Tobacco, Cigars, and/or Pipes): Yes  Has patient been referred to the Quitline?: Patient refused referral  Patient has been referred for addiction treatment: Yes  Lynden OxfordKadijah R Shana Younge, MSW, LCSW-A 08/06/2016, 10:36 AM

## 2016-08-06 NOTE — [facility] Group Notes (Signed)
[facility] LCSW Group Therapy   08/06/2016 9:30 am Type of Therapy: Group Therapy   Participation Level: Active   Participation Quality: Attentive, Sharing and Supportive   Affect: Appropriate    Cognitive: Alert and Oriented   Insight: Developing/Improving and Engaged   Engagement in Therapy: Developing/Improving and Engaged   Modes of Intervention: Clarification, Confrontation, Discussion, Education, Exploration,  Limit-setting, Orientation, Problem-solving, Rapport Building, Dance movement psychotherapisteality Testing, Socialization and Support   Summary of Progress/Problems: Pt identified obstacles faced currently and processed barriers involved in overcoming these obstacles. Pt identified steps necessary for overcoming these obstacles and explored motivation (internal and external) for facing these difficulties head on. Pt further identified one area of concern in their lives and chose a goal to focus on for today.   Hampton AbbotKadijah Errol Ala, MSW, LCSW-A 08/06/2016, 11:53AM

## 2016-08-06 NOTE — Progress Notes (Signed)
Patient was observed in the dayroom with peers most of the evening.  He was pleasant on contact but denied negative thoughts or low mood.  He was compliant with unit rules.

## 2016-08-09 ENCOUNTER — Telehealth: Payer: Self-pay | Admitting: *Deleted

## 2016-08-09 NOTE — Telephone Encounter (Signed)
Patient's left inguinal hernia repair was scheduled for 09-10-16 with Dr. Evette CristalSankar.   Dr. Evette CristalSankar will not be operating that day due to a schedule change.   Patient wishes to cancel surgery for now and call the office back to reschedule.

## 2016-08-10 ENCOUNTER — Encounter: Payer: Self-pay | Admitting: Cardiothoracic Surgery

## 2016-08-10 ENCOUNTER — Ambulatory Visit
Admission: RE | Admit: 2016-08-10 | Discharge: 2016-08-10 | Disposition: A | Discharge status: Home / Self Care | Payer: BLUE CROSS/BLUE SHIELD | Source: Ambulatory Visit | Attending: Cardiothoracic Surgery | Admitting: Cardiothoracic Surgery

## 2016-08-10 ENCOUNTER — Ambulatory Visit (INDEPENDENT_AMBULATORY_CARE_PROVIDER_SITE_OTHER): Payer: BLUE CROSS/BLUE SHIELD | Admitting: Cardiothoracic Surgery

## 2016-08-10 VITALS — BP 102/71 | HR 78 | Temp 97.8°F | Ht 69.0 in | Wt 127.0 lb

## 2016-08-10 DIAGNOSIS — J9383 Other pneumothorax: Secondary | ICD-10-CM | POA: Insufficient documentation

## 2016-08-10 NOTE — Patient Instructions (Signed)
Please give our office a call if you need to schedule an appointment.

## 2016-08-13 NOTE — Progress Notes (Signed)
  Patient ID: Richard Harrington, male   DOB: [birth year], 46 y.o.   MRN: [id number]  HISTORY: Admitted with incidental pneumothorax.  No shortness of breath.  No chest pain.  Feels well overall.   Vitals:   08/10/16 1017  BP: 102/71  Pulse: 78  Temp: 97.8 F (36.6 C)     EXAM:    Resp: Lungs are clear bilaterally.  No respiratory distress, normal effort. Heart:  Regular without murmurs Abd:  Abdomen is soft, non distended and non tender. No masses are palpable.  There is no rebound and no guarding.  Neurological: Alert and oriented to person, place, and time. Coordination normal.  Skin: Skin is warm and dry. No rash noted. No diaphoretic. No erythema. No pallor.  Psychiatric: Normal mood and affect. Normal behavior. Judgment and thought content normal.    ASSESSMENT: Incidental spontaneous right pneumothorax - first occurrence.  Independent review of chest xray shows lung to be fully expanded without pneumothorax   PLAN:   Will followup with patient on an as needed basis.    Hulda Marinimothy Liston Thum, MD

## 2016-08-13 NOTE — Telephone Encounter (Signed)
Patient was contacted today and made aware that Dr. Evette CristalSankar can do patient's surgery on 09-10-16 if he still would like that date. Originally, Dr. Evette CristalSankar was going to take vacation that day.   This patient states that he is only working one day this week and his boss will not be back until next week.   He will call the office and let us know when he wishes to proceed after speaking with his boss.

## 2016-08-21 ENCOUNTER — Telehealth: Payer: Self-pay

## 2016-08-21 NOTE — Telephone Encounter (Signed)
Patient called to reschedule his surgery. The patient is [facility] 09[facility]10[facility]. He will pre admit by phone. He will have a pre op visit in the office on 08/29/16 at 11:45 am. The patient is aware of dates, time, and instructions.

## 2016-08-29 ENCOUNTER — Ambulatory Visit (INDEPENDENT_AMBULATORY_CARE_PROVIDER_SITE_OTHER): Payer: BLUE CROSS/BLUE SHIELD | Admitting: General Surgery

## 2016-08-29 ENCOUNTER — Encounter: Payer: Self-pay | Admitting: General Surgery

## 2016-08-29 ENCOUNTER — Ambulatory Visit: Payer: BLUE CROSS/BLUE SHIELD | Admitting: General Surgery

## 2016-08-29 VITALS — BP 102/64 | HR 74 | Resp 12 | Ht 69.0 in | Wt 125.0 lb

## 2016-08-29 DIAGNOSIS — K4090 Unilateral inguinal hernia, without obstruction or gangrene, not specified as recurrent: Secondary | ICD-10-CM | POA: Diagnosis not present

## 2016-08-29 NOTE — Patient Instructions (Signed)
Patient is scheduled for surgery ob 09/10/2016.

## 2016-08-29 NOTE — Progress Notes (Signed)
Patient ID: Richard Harrington, male   DOB: 1970/08/07, 46 y.o.   MRN: [id number]  Chief Complaint  Patient presents with  . Pre-op Exam    HPI Richard Harrington is a 46 y.o. male here today for his pre op left inguinal hernia repair scheduled on 09/10/2016. No interval problems since last seen in May this yr. HPI  Past Medical History:  Diagnosis Date  . Depression   . Inguinal hernia    left  . Pneumohemothorax   . Substance abuse     Past Surgical History:  Procedure Laterality Date  . INCISION AND DRAINAGE ABSCESS     tonsil abscess    Family History  Problem Relation Age of Onset  . Colon cancer Neg Hx   . Breast cancer Neg Hx     Social History Social History  Substance Use Topics  . Smoking status: Former Smoker    Packs/day: 0.50    Years: 24.00    Types: Cigarettes  . Smokeless tobacco: Never Used  . Alcohol use No     Comment: occasional    No Known Allergies  Current Outpatient Prescriptions  Medication Sig Dispense Refill  . albuterol (PROVENTIL HFA;VENTOLIN HFA) 108 (90 Base) MCG/ACT inhaler Inhale 1-2 puffs into the lungs every 4 (four) hours as needed for wheezing or shortness of breath. 1 Inhaler 0  . traZODone (DESYREL) 50 MG tablet Take 1 tablet (50 mg total) by mouth at bedtime as needed for sleep. 30 tablet 0  . FLUoxetine (PROZAC) 20 MG capsule Take 1 capsule (20 mg total) by mouth daily. (Patient not taking: Reported on 08/29/2016) 30 capsule 0   No current facility-administered medications for this visit.     Review of Systems Review of Systems  Constitutional: Negative.   Respiratory: Negative.   Cardiovascular: Negative.     Blood pressure 102/64, pulse 74, resp. rate 12, height 5\' 9"  (1.753 m), weight 125 lb (56.7 kg).  Physical Exam Physical Exam  Constitutional: He is oriented to person, place, and time. He appears well-developed and well-nourished.  Eyes: Conjunctivae are normal. No scleral icterus.  Neck: Neck supple.   Cardiovascular: Normal rate, regular rhythm and normal heart sounds.   Pulmonary/Chest: Effort normal and breath sounds normal.  Abdominal: Soft. Normal appearance and bowel sounds are normal. There is no hepatomegaly. There is no tenderness. A hernia is present. Hernia confirmed positive in the left inguinal area.  Lymphadenopathy:    He has no cervical adenopathy.  Neurological: He is alert and oriented to person, place, and time.  Skin: Skin is warm and dry.    Data Reviewed Prior notes reviewed   Assessment    Left inguinal henria, small and symptomatic    Plan   Discussed procedure with pt again and he opted to have open repair Hernia precautions and incarceration were discussed with the patient. If they develop symptoms of an incarcerated hernia, they were encouraged to seek prompt medical attention.  I have recommended repair of the hernia using mesh on an outpatient basis in the near future. The risk of infection was reviewed. The role of prosthetic mesh to minimize the risk of recurrence was reviewed.  HPI, Physical Exam, Assessment and Plan have been scribed under the direction and in the presence of Kathreen CosierS. G. Jillayne Witte, MD  Ples SpecterJessica Qualls, CMA     I have completed the exam and reviewed the above documentation for accuracy and completeness.  I agree with the above.  Museum/gallery conserv[employer] has  been used and any errors in dictation or transcription are unintentional.  Esther Bradstreet G. Evette CristalSankar, M.D., F.A.C.S.    [name],Cherokee Clowers G 08/29/2016, 11:13 AM  Patient's [facility].   [name], CMA

## 2016-08-31 ENCOUNTER — Encounter
Admission: RE | Admit: 2016-08-31 | Discharge: 2016-08-31 | Disposition: A | Discharge status: Home / Self Care | Payer: BLUE CROSS/BLUE SHIELD | Source: Ambulatory Visit | Attending: General Surgery | Admitting: General Surgery

## 2016-08-31 NOTE — Patient Instructions (Signed)
  Your procedure is scheduled on: 09-10-16  Report to Same Day Surgery 2nd floor medical mall [facility](Medical Mall Entrance-take elevator on left to 2nd floor.  Check in with surgery information desk.) To find out your arrival time please call [phone] between 1PM - 3PM on 09-07-16  Remember: Instructions that are not followed completely may result in serious medical risk, up to and including death, or upon the discretion of your surgeon and anesthesiologist your surgery may need to be rescheduled.    _x___ 1. Do not eat food or drink liquids after midnight. No gum chewing or  hard candies.     __x__ 2. No Alcohol for 24 hours before or after surgery.   __x__3. No Smoking for 24 prior to surgery.   ____  4. Bring all medications with you on the day of surgery if instructed.    __x__ 5. Notify your doctor if there is any change in your medical condition     (cold, fever, infections).     Do not wear jewelry, make-up, hairpins, clips or nail polish.  Do not wear lotions, powders, or perfumes. You may wear deodorant.  Do not shave 48 hours prior to surgery. Men may shave face and neck.  Do not bring valuables to the hospital.    [facility] not [facility] any belongings or valuables.               Contacts, dentures or bridgework may not be worn into surgery.  Leave your suitcase in the car. After surgery it may be brought to your room.  For patients admitted to the hospital, discharge time is determined by your treatment team.   Patients discharged the day of surgery will not be allowed to drive home.  You will need someone to drive you home and stay with you the night of your procedure.    Please read over the following fact sheets that you were given:   [facility] Preparing for [facility] and or MRSA Information   ____ Take anti-hypertensive (unless it includes a diuretic), cardiac, seizure, asthma,     anti-reflux and psychiatric medicines. These include:  1.  NONE  2.  3.  4.  5.  6.  ____Fleets enema or Magnesium Citrate as directed.   ____ Use CHG Soap or sage wipes as directed on instruction sheet   __X__ Use inhalers on the day of surgery and bring to hospital day of surgery-USE ALBUTEROL INHALER AT HOME AND BRING TO HOSPITAL  ____ Stop Metformin and Janumet 2 days prior to surgery.    ____ Take 1/2 of usual insulin dose the night before surgery and none on the morning     surgery.   ____ Follow recommendations from Cardiologist, Pulmonologist or PCP regarding          stopping Aspirin, Coumadin, Pllavix ,Eliquis, Effient, or Pradaxa, and Pletal.  X____Stop Anti-inflammatories such as Advil, Aleve, IBUPROFEN, Motrin, Naproxen, Naprosyn, Goodies powders, EXCEDRIN MIGRAINE or aspirin products 7 DAYS PRIOR TO SURGERY-OK to take Tylenol    ____ Stop supplements until after surgery.  ____ Bring C-Pap to the hospital.

## 2016-08-31 NOTE — Pre-Procedure Instructions (Signed)
EKG  ED ECG REPORT I, Jene EveryKINNER, ROBERT, the attending physician, personally viewed and interpreted this ECG.  Date: 07/29/2016  Rate: 63 Rhythm: normal sinus rhythm QRS Axis: normal Intervals: normal ST/T Wave abnormalities: normal   ____________________________________________  RADIOLOGY  Notified by radiology of right apical pneumothorax ____________________________________________   PROCEDURES  Procedure(s) performed: yes  CHEST TUBE INSERTION Date/Time: 07/29/2016 at 11:20 PM Performed by: Jene EveryKINNER, ROBERT Consent: The procedure was performed in an emergent situation. Imaging studies: imaging studies available Required items: required blood products, implants, devices, and special equipment available Patient identity confirmed: arm band and available demographic data Time out: Immediately prior to procedure a "time out" was called to verify the correct patient, procedure, equipment, support staff and site/side marked as required.  Indications: pneumothorax  Patient sedated: patient already sedated s/p overdose Anesthesia: yes Preparation: skin prepped with Betadine  Placement location: right lateral  Scalpel size: 10 Tube size: Pigtail catheter   Rush of air heard Tube connected to: water seal   Suture material: 3-0 ethilon   Post-insertion x-ray findings: interval decrease in PTX size Patient tolerance: Patient tolerated the procedure well with no immediate complications        Critical Care performed: yes  CRITICAL CARE Performed by: Jene EveryKINNER, ROBERT   Total critical care time: 35 minutes  Critical care time was exclusive of separately billable procedures and treating other patients.  Critical care was necessary to treat or prevent imminent or life-threatening deterioration.  Critical care was time spent personally by me on the following activities: development of treatment plan with patient and/or surrogate as  well as nursing, discussions with consultants, evaluation of patient's response to treatment, examination of patient, obtaining history from patient or surrogate, ordering and performing treatments and interventions, ordering and review of laboratory studies, ordering and review of radiographic studies, pulse oximetry and re-evaluation of patient's condition. ____________________________________________   INITIAL IMPRESSION / ASSESSMENT AND PLAN / ED COURSE  Pertinent labs & imaging results that were available during my care of the patient were reviewed by me and considered in my medical decision making (see chart for details).  Patient initially presented after intentional drug overdose in attempt to harm himself. He took a" handful" of Zanaflex and "ate meth". Patient given narcan by EMS with no response. Drowsy but arousable. IV fluids, cardiac monitoring. PC contacted by RN. Involuntary commitment paperwork filled out.  Notified by Radiology of right PTX. D/w surgery Dr. Aleen CampiPiscoya, recommended pigtail insertion  Right pigtail tube inserted as noted above after timeout and confirmation of appropriate side by myself and RN   ____________________________________________   FINAL CLINICAL IMPRESSION(S) / ED DIAGNOSES  Final diagnoses:  Intentional drug overdose, initial encounter (HCC)  Pneumothorax, unspecified type      NEW MEDICATIONS STARTED DURING THIS VISIT:     New Prescriptions   No medications on file     Note:  This document was prepared using Dragon voice recognition software and may include unintentional dictation errors.    Jene EveryKinner, Robert, MD 07/29/16 2333     Electronically signed by Jene EveryKinner, Robert, MD at 07/29/2016 11:33 PM      ED to Hosp-Admission (Discharged) on 07/29/2016        Detailed Report

## 2016-09-05 ENCOUNTER — Telehealth: Payer: Self-pay | Admitting: *Deleted

## 2016-09-05 NOTE — Telephone Encounter (Signed)
Patient is having inguinal hernia surgery on Monday 09/10/16 and at work he stands up a lot and lifts heavy things, which is been causing him pain. He wants to know if he can get a note to go ahead and put him out of work for the rest of the week.

## 2016-09-05 NOTE — Telephone Encounter (Signed)
I talked with Dr Evette CristalSankar and he states that since surgery is within a short time, given his pain with activity it would be responsible to be out of work prior to surgery.Note provided, pt to pick up tomorrow.

## 2016-09-07 ENCOUNTER — Other Ambulatory Visit: Payer: Self-pay

## 2016-09-09 MED ORDER — CEFAZOLIN SODIUM-DEXTROSE 2-4 GM/100ML-% IV SOLN
2.0000 g | INTRAVENOUS | Status: AC
  Administered 2016-09-10: 2 g via INTRAVENOUS

## 2016-09-10 ENCOUNTER — Ambulatory Visit
Admission: RE | Admit: 2016-09-10 | Discharge: 2016-09-10 | Disposition: A | Discharge status: Home / Self Care | Payer: BLUE CROSS/BLUE SHIELD | Source: Ambulatory Visit | Attending: General Surgery | Admitting: General Surgery

## 2016-09-10 ENCOUNTER — Encounter
Admission: RE | Disposition: A | Discharge status: Home / Self Care | Payer: Self-pay | Source: Ambulatory Visit | Attending: General Surgery

## 2016-09-10 ENCOUNTER — Ambulatory Visit: Payer: BLUE CROSS/BLUE SHIELD | Admitting: Anesthesiology

## 2016-09-10 ENCOUNTER — Encounter: Payer: Self-pay | Admitting: *Deleted

## 2016-09-10 ENCOUNTER — Ambulatory Visit: Admit: 2016-09-10 | Payer: Medicaid Other | Admitting: General Surgery

## 2016-09-10 DIAGNOSIS — Z79899 Other long term (current) drug therapy: Secondary | ICD-10-CM | POA: Diagnosis not present

## 2016-09-10 DIAGNOSIS — Z87891 Personal history of nicotine dependence: Secondary | ICD-10-CM | POA: Insufficient documentation

## 2016-09-10 DIAGNOSIS — F329 Major depressive disorder, single episode, unspecified: Secondary | ICD-10-CM | POA: Diagnosis not present

## 2016-09-10 DIAGNOSIS — K4090 Unilateral inguinal hernia, without obstruction or gangrene, not specified as recurrent: Secondary | ICD-10-CM | POA: Diagnosis not present

## 2016-09-10 HISTORY — PX: INGUINAL HERNIA REPAIR: SHX194

## 2016-09-10 LAB — URINE DRUG SCREEN, QUALITATIVE (ARMC ONLY)
Amphetamines, Ur Screen: NOT DETECTED
Barbiturates, Ur Screen: NOT DETECTED
Benzodiazepine, Ur Scrn: NOT DETECTED
CANNABINOID 50 NG, UR ~~LOC~~: NOT DETECTED
COCAINE METABOLITE, UR ~~LOC~~: NOT DETECTED
MDMA (ECSTASY) UR SCREEN: NOT DETECTED
Methadone Scn, Ur: NOT DETECTED
OPIATE, UR SCREEN: NOT DETECTED
PHENCYCLIDINE (PCP) UR S: NOT DETECTED
Tricyclic, Ur Screen: NOT DETECTED

## 2016-09-10 SURGERY — REPAIR, HERNIA, INGUINAL, ADULT
Anesthesia: General | Laterality: Left | Wound class: Clean

## 2016-09-10 SURGERY — REPAIR, HERNIA, INGUINAL, ADULT
Anesthesia: Choice | Laterality: Left

## 2016-09-10 MED ORDER — LIDOCAINE HCL 1 % IJ SOLN
INTRAMUSCULAR | Status: DC | PRN
  Administered 2016-09-10: 45 mL

## 2016-09-10 MED ORDER — OXYCODONE-ACETAMINOPHEN 5-325 MG PO TABS: 1.0000 | ORAL_TABLET | Freq: Four times a day (QID) | ORAL | 0 refills | Status: AC | PRN

## 2016-09-10 MED ORDER — FENTANYL CITRATE (PF) 100 MCG/2ML IJ SOLN
INTRAMUSCULAR | Status: AC
  Filled 2016-09-10: qty 2

## 2016-09-10 MED ORDER — LACTATED RINGERS IV SOLN
INTRAVENOUS | Status: DC
  Administered 2016-09-10 (×2): via INTRAVENOUS

## 2016-09-10 MED ORDER — LIDOCAINE HCL (PF) 1 % IJ SOLN
INTRAMUSCULAR | Status: AC
  Filled 2016-09-10: qty 30

## 2016-09-10 MED ORDER — KETOROLAC TROMETHAMINE 30 MG/ML IJ SOLN
INTRAMUSCULAR | Status: DC | PRN
  Administered 2016-09-10: 30 mg via INTRAVENOUS

## 2016-09-10 MED ORDER — FAMOTIDINE 20 MG PO TABS
20.0000 mg | ORAL_TABLET | Freq: Once | ORAL | Status: AC
  Administered 2016-09-10: 20 mg via ORAL

## 2016-09-10 MED ORDER — MIDAZOLAM HCL 2 MG/2ML IJ SOLN
INTRAMUSCULAR | Status: DC | PRN
  Administered 2016-09-10: 2 mg via INTRAVENOUS

## 2016-09-10 MED ORDER — KETOROLAC TROMETHAMINE 30 MG/ML IJ SOLN
INTRAMUSCULAR | Status: AC
  Filled 2016-09-10: qty 1

## 2016-09-10 MED ORDER — PROPOFOL 10 MG/ML IV BOLUS
INTRAVENOUS | Status: DC | PRN
  Administered 2016-09-10: 30 mg via INTRAVENOUS

## 2016-09-10 MED ORDER — FENTANYL CITRATE (PF) 100 MCG/2ML IJ SOLN: 25.0000 ug | INTRAMUSCULAR | Status: DC | PRN

## 2016-09-10 MED ORDER — ROCURONIUM BROMIDE 50 MG/5ML IV SOLN
INTRAVENOUS | Status: AC
  Filled 2016-09-10: qty 1

## 2016-09-10 MED ORDER — FAMOTIDINE 20 MG PO TABS
ORAL_TABLET | ORAL | Status: AC
  Filled 2016-09-10: qty 1

## 2016-09-10 MED ORDER — ACETAMINOPHEN 10 MG/ML IV SOLN
INTRAVENOUS | Status: AC
  Filled 2016-09-10: qty 100

## 2016-09-10 MED ORDER — CHLORHEXIDINE GLUCONATE CLOTH 2 % EX PADS: 6.0000 | MEDICATED_PAD | Freq: Once | CUTANEOUS | Status: DC

## 2016-09-10 MED ORDER — ONDANSETRON HCL 4 MG/2ML IJ SOLN: 4.0000 mg | Freq: Once | INTRAMUSCULAR | Status: DC | PRN

## 2016-09-10 MED ORDER — ACETAMINOPHEN 10 MG/ML IV SOLN
INTRAVENOUS | Status: DC | PRN
  Administered 2016-09-10: 1000 mg via INTRAVENOUS

## 2016-09-10 MED ORDER — CEFAZOLIN SODIUM-DEXTROSE 2-4 GM/100ML-% IV SOLN
INTRAVENOUS | Status: AC
  Filled 2016-09-10: qty 100

## 2016-09-10 MED ORDER — PROPOFOL 10 MG/ML IV BOLUS
INTRAVENOUS | Status: AC
Start: 2016-09-10 — End: 2016-09-10
  Filled 2016-09-10: qty 20

## 2016-09-10 MED ORDER — PROPOFOL 10 MG/ML IV BOLUS
INTRAVENOUS | Status: AC
  Filled 2016-09-10: qty 20

## 2016-09-10 MED ORDER — EPHEDRINE SULFATE 50 MG/ML IJ SOLN
INTRAMUSCULAR | Status: DC | PRN
  Administered 2016-09-10: 10 mg via INTRAVENOUS

## 2016-09-10 MED ORDER — BUPIVACAINE HCL (PF) 0.5 % IJ SOLN
INTRAMUSCULAR | Status: AC
  Filled 2016-09-10: qty 30

## 2016-09-10 MED ORDER — PROPOFOL 500 MG/50ML IV EMUL
INTRAVENOUS | Status: DC | PRN
  Administered 2016-09-10: 180 ug/kg/min via INTRAVENOUS

## 2016-09-10 MED ORDER — MIDAZOLAM HCL 2 MG/2ML IJ SOLN
INTRAMUSCULAR | Status: AC
  Filled 2016-09-10: qty 2

## 2016-09-10 MED ORDER — FENTANYL CITRATE (PF) 100 MCG/2ML IJ SOLN
INTRAMUSCULAR | Status: DC | PRN
  Administered 2016-09-10: 50 ug via INTRAVENOUS

## 2016-09-10 SURGICAL SUPPLY — 31 items
ADH SKN CLS APL DERMABOND .7 (GAUZE/BANDAGES/DRESSINGS) ×1
BLADE SURG 15 STRL SS SAFETY (BLADE) ×3 IMPLANT
CANISTER SUCT 1200ML W/VALVE (MISCELLANEOUS) ×3 IMPLANT
CHLORAPREP W/TINT 26ML (MISCELLANEOUS) ×3 IMPLANT
DECANTER SPIKE VIAL GLASS SM (MISCELLANEOUS) ×6 IMPLANT
DERMABOND ADVANCED (GAUZE/BANDAGES/DRESSINGS) ×2
DERMABOND ADVANCED .7 DNX12 (GAUZE/BANDAGES/DRESSINGS) ×1 IMPLANT
DRAIN PENROSE 1/4X12 LTX (DRAIN) ×3 IMPLANT
DRAPE LAPAROTOMY 100X77 ABD (DRAPES) ×3 IMPLANT
ELECT REM PT RETURN 9FT ADLT (ELECTROSURGICAL) ×3
ELECTRODE REM PT RTRN 9FT ADLT (ELECTROSURGICAL) ×1 IMPLANT
GLOVE BIO SURGEON STRL SZ7 (GLOVE) ×13 IMPLANT
GOWN STRL REUS W/ TWL LRG LVL3 (GOWN DISPOSABLE) ×2 IMPLANT
GOWN STRL REUS W/TWL LRG LVL3 (GOWN DISPOSABLE) ×9
KIT RM TURNOVER STRD PROC AR (KITS) ×3 IMPLANT
LABEL OR SOLS (LABEL) ×3 IMPLANT
MESH PARIETEX PROGRIP LEFT (Mesh General) ×2 IMPLANT
NDL HYPO 25X1 1.5 SAFETY (NEEDLE) ×1 IMPLANT
NEEDLE HYPO 22GX1.5 SAFETY (NEEDLE) ×3 IMPLANT
NEEDLE HYPO 25X1 1.5 SAFETY (NEEDLE) ×3 IMPLANT
NS IRRIG 500ML POUR BTL (IV SOLUTION) ×3 IMPLANT
PACK BASIN MINOR ARMC (MISCELLANEOUS) ×3 IMPLANT
SUT PDS 2-0 27IN (SUTURE) ×6 IMPLANT
SUT VIC AB 2-0 SH 27 (SUTURE) ×3
SUT VIC AB 2-0 SH 27XBRD (SUTURE) ×1 IMPLANT
SUT VIC AB 3-0 54X BRD REEL (SUTURE) ×1 IMPLANT
SUT VIC AB 3-0 BRD 54 (SUTURE) ×3
SUT VIC AB 3-0 SH 27 (SUTURE) ×3
SUT VIC AB 3-0 SH 27X BRD (SUTURE) ×1 IMPLANT
SUT VIC AB 4-0 FS2 27 (SUTURE) ×3 IMPLANT
SYR CONTROL 10ML (SYRINGE) ×3 IMPLANT

## 2016-09-10 NOTE — Anesthesia Procedure Notes (Signed)
Date/Time: 09/10/2016 9:05 AM Performed by: Henrietta HooverPOPE, Anzley Dibbern Pre-anesthesia Checklist: Patient identified, Emergency Drugs available, Suction available, Patient being monitored and Timeout performed Patient Re-evaluated:Patient Re-evaluated prior to induction Oxygen Delivery Method: Nasal cannula Placement Confirmation: positive ETCO2

## 2016-09-10 NOTE — Op Note (Addendum)
Preop diagnosis left inguinal hernia  Post op diagnosis: Left inguinal hernia indirect  Operation: Repair left inguinal hernia with Progrip mesh  Surgeon: Kathreen CosierS. G. Ona Rathert   Assistant:     Anesthesia: Monitored anesthesia care  Complications: None  EBL: Minimal  Drains: None  Description: Patient was placed supine on the operating table and with adequate IV sedation and monitoring the left inguinal region was prepped and draped as sterile field. Timeout was performed. Local anesthetic containing 0.5% Marcaine mixed with 1% Xylocaine was instilled in the inguinal region on the left to obtain an adequate block. Total of 45 mL was used. Skin incision was made along the medial two thirds of the inguinal canal and deepened through the layers down to the external oblique. Bleeding was controlled cautery and ligatures of 3-0 Vicryl. External oblique was opened along the line of its fibers to expose the inguinal canal. The cord was dissected free from the posterior wall and no direct hernia was identified. The fascia over the cord was incised and dissection showed the patient had a fairly sizable indirect sac with no contents in it at this time. The sac was freed from all the surrounding cord structures down to the internal ring area and was opened to ensure there were no contents. The neck of the sac was ligated high with 2-0 Vicryl and amputated. The suture was used to transfix it to the undersurface of the internal oblique muscle. Following this the fascia over the cord was reapproximated with 2-0 Vicryl. A Progrip mesh was brought up to the field and placed around the cord after trimming the edges and laid down against the posterior wall. Lateral ends were tucked underneath the external oblique. The ilioinguinal nerve was not identified within the inguinal canal area but appeared to be a high level. After ensuring proper placement of mesh the medial and was tacked to the pubic tubercle with 2 stitches of  2-0 PDS. The next external oblique was then closed over with   running 2-0 PDS. Subcutaneous tissue closed with the 3-0 Vicryl. Skin was closed with subcuticular 4-0 Vicryl covered with Dermabond. Procedure was well-tolerated patient returned recovery room thereafter in stable condition

## 2016-09-10 NOTE — H&P (View-Only) (Signed)
Patient ID: Richard Harrington, male   DOB: 1970/08/07, 46 y.o.   MRN: [id number]  Chief Complaint  Patient presents with  . Pre-op Exam    HPI Richard Harrington is a 46 y.o. male here today for his pre op left inguinal hernia repair scheduled on 09/10/2016. No interval problems since last seen in May this yr. HPI  Past Medical History:  Diagnosis Date  . Depression   . Inguinal hernia    left  . Pneumohemothorax   . Substance abuse     Past Surgical History:  Procedure Laterality Date  . INCISION AND DRAINAGE ABSCESS     tonsil abscess    Family History  Problem Relation Age of Onset  . Colon cancer Neg Hx   . Breast cancer Neg Hx     Social History Social History  Substance Use Topics  . Smoking status: Former Smoker    Packs/day: 0.50    Years: 24.00    Types: Cigarettes  . Smokeless tobacco: Never Used  . Alcohol use No     Comment: occasional    No Known Allergies  Current Outpatient Prescriptions  Medication Sig Dispense Refill  . albuterol (PROVENTIL HFA;VENTOLIN HFA) 108 (90 Base) MCG/ACT inhaler Inhale 1-2 puffs into the lungs every 4 (four) hours as needed for wheezing or shortness of breath. 1 Inhaler 0  . traZODone (DESYREL) 50 MG tablet Take 1 tablet (50 mg total) by mouth at bedtime as needed for sleep. 30 tablet 0  . FLUoxetine (PROZAC) 20 MG capsule Take 1 capsule (20 mg total) by mouth daily. (Patient not taking: Reported on 08/29/2016) 30 capsule 0   No current facility-administered medications for this visit.     Review of Systems Review of Systems  Constitutional: Negative.   Respiratory: Negative.   Cardiovascular: Negative.     Blood pressure 102/64, pulse 74, resp. rate 12, height 5\' 9"  (1.753 m), weight 125 lb (56.7 kg).  Physical Exam Physical Exam  Constitutional: He is oriented to person, place, and time. He appears well-developed and well-nourished.  Eyes: Conjunctivae are normal. No scleral icterus.  Neck: Neck supple.   Cardiovascular: Normal rate, regular rhythm and normal heart sounds.   Pulmonary/Chest: Effort normal and breath sounds normal.  Abdominal: Soft. Normal appearance and bowel sounds are normal. There is no hepatomegaly. There is no tenderness. A hernia is present. Hernia confirmed positive in the left inguinal area.  Lymphadenopathy:    He has no cervical adenopathy.  Neurological: He is alert and oriented to person, place, and time.  Skin: Skin is warm and dry.    Data Reviewed Prior notes reviewed   Assessment    Left inguinal henria, small and symptomatic    Plan   Discussed procedure with pt again and he opted to have open repair Hernia precautions and incarceration were discussed with the patient. If they develop symptoms of an incarcerated hernia, they were encouraged to seek prompt medical attention.  I have recommended repair of the hernia using mesh on an outpatient basis in the near future. The risk of infection was reviewed. The role of prosthetic mesh to minimize the risk of recurrence was reviewed.  HPI, Physical Exam, Assessment and Plan have been scribed under the direction and in the presence of Kathreen CosierS. G. [name], MD  Ples SpecterJessica Qualls, CMA     I have completed the exam and reviewed the above documentation for accuracy and completeness.  I agree with the above.  Museum/gallery conservator[employer]agon [employer] has  been used and any errors in dictation or transcription are unintentional.  [name]. [name], M.D., F.A.C.S.    [name],[name] 08/29/2016, 11:13 AM  Patient's surgery has been scheduled for 09-10-16 at [facility].   Michele J. Bailey, CMA    

## 2016-09-10 NOTE — Anesthesia Post-op Follow-up Note (Cosign Needed)
Anesthesia QCDR form completed.        

## 2016-09-10 NOTE — Interval H&P Note (Signed)
History and Physical Interval Note:  09/10/2016 8:42 AM  Richard EastMichael Shane Sanpedro  has presented today for surgery, with the diagnosis of LEFT INGUINAL HERNIA  The various methods of treatment have been discussed with the patient and family. After consideration of risks, benefits and other options for treatment, the patient has consented to  Procedure(s): HERNIA REPAIR INGUINAL ADULT (Left) as a surgical intervention .  The patient's history has been reviewed, patient examined, no change in status, stable for surgery.  I have reviewed the patient's chart and labs.  Questions were answered to the patient's satisfaction.     [name],[name]

## 2016-09-10 NOTE — Transfer of Care (Signed)
Immediate Anesthesia Transfer of Care Note  Patient: Richard EastMichael Shane Tardiff  Procedure(s) Performed: Procedure(s): HERNIA REPAIR INGUINAL ADULT (Left)  Patient Location: PACU  Anesthesia Type:General  Level of Consciousness: awake  Airway & Oxygen Therapy: Patient Spontanous Breathing and Patient connected to nasal cannula oxygen  Post-op Assessment: Report given to RN and Post -op Vital signs reviewed and stable  Post vital signs: Reviewed and stable  Last Vitals:  Vitals:   09/10/16 0729 09/10/16 1024  BP: 114/85 112/80  Pulse: 64 81  Resp: 14 10  Temp: 36.6 C 36.4 C    Last Pain:  Vitals:   09/10/16 1024  TempSrc:   PainSc: 0-No pain         Complications: No apparent anesthesia complications

## 2016-09-10 NOTE — Anesthesia Postprocedure Evaluation (Signed)
Anesthesia Post Note  Patient: Eligah EastMichael Shane Rosier  Procedure(s) Performed: Procedure(s) (LRB): HERNIA REPAIR INGUINAL ADULT (Left)  Patient location during evaluation: PACU Anesthesia Type: General Level of consciousness: awake and alert and oriented Pain management: pain level controlled Vital Signs Assessment: post-procedure vital signs reviewed and stable Respiratory status: spontaneous breathing Cardiovascular status: blood pressure returned to baseline Anesthetic complications: no     Last Vitals:  Vitals:   09/10/16 1040 09/10/16 1056  BP: 116/83 127/86  Pulse: 72 81  Resp: 17 (!) 26  Temp:  36.9 C    Last Pain:  Vitals:   09/10/16 1056  TempSrc:   PainSc: 0-No pain                 Librada Castronovo

## 2016-09-10 NOTE — Anesthesia Preprocedure Evaluation (Addendum)
Anesthesia Evaluation  Patient identified by MRN, date of birth, ID band Patient awake    Airway Mallampati: II  TM Distance: >3 FB     Dental  (+) Chipped   Pulmonary COPD,  COPD inhaler, former smoker,    Pulmonary exam normal        Cardiovascular negative cardio ROS Normal cardiovascular exam     Neuro/Psych PSYCHIATRIC DISORDERS Depression negative neurological ROS     GI/Hepatic (+)     substance abuse  cocaine use, Inguinal hernia   Endo/Other  negative endocrine ROS  Renal/GU negative Renal ROS     Musculoskeletal negative musculoskeletal ROS (+)   Abdominal Normal abdominal exam  (+)   Peds  Hematology negative hematology ROS (+)   Anesthesia Other Findings   Reproductive/Obstetrics                            Anesthesia Physical Anesthesia Plan  ASA: II  Anesthesia Plan: General   Post-op Pain Management:    Induction: Intravenous  PONV Risk Score and Plan:   Airway Management Planned: Nasal Cannula  Additional Equipment:   Intra-op Plan:   Post-operative Plan:   Informed Consent: I have reviewed the patients History and Physical, chart, labs and discussed the procedure including the risks, benefits and alternatives for the proposed anesthesia with the patient or authorized representative who has indicated his/her understanding and acceptance.   Dental advisory given  Plan Discussed with: Surgeon and CRNA  Anesthesia Plan Comments: (Patient agreeable to TIVA)       Anesthesia Quick Evaluation

## 2016-09-17 ENCOUNTER — Encounter: Payer: Self-pay | Admitting: General Surgery

## 2016-09-17 ENCOUNTER — Ambulatory Visit (INDEPENDENT_AMBULATORY_CARE_PROVIDER_SITE_OTHER): Payer: BLUE CROSS/BLUE SHIELD | Admitting: General Surgery

## 2016-09-17 VITALS — BP 122/68 | HR 78 | Resp 14 | Ht 69.0 in | Wt 126.0 lb

## 2016-09-17 DIAGNOSIS — K4090 Unilateral inguinal hernia, without obstruction or gangrene, not specified as recurrent: Secondary | ICD-10-CM

## 2016-09-17 NOTE — Patient Instructions (Addendum)
No exertional activity, wait 3 weeks before resuming full activity at work.  Follow up in 5 weeks.

## 2016-09-17 NOTE — Progress Notes (Signed)
Patient ID: Richard Harrington, male   DOB: [birth year], 46 y.o.   MRN: [id number]  Chief Complaint  Patient presents with  . Routine Post Op    HPI Richard EastMichael Shane Klipfel is a 46 y.o. male is here today for a one week post op left inguinal hernia repair done on 09/10/16. Patient states he is doing well. Moving bowels and urinating regularly. HPI  Past Medical History:  Diagnosis Date  . Depression   . Inguinal hernia    left  . Pneumohemothorax   . Substance abuse     Past Surgical History:  Procedure Laterality Date  . INCISION AND DRAINAGE ABSCESS     tonsil abscess  . INGUINAL HERNIA REPAIR Left 09/10/2016   Procedure: HERNIA REPAIR INGUINAL ADULT;  Surgeon: Kieth BrightlySankar, Cameron Katayama G, MD;  Location: [facility] ORS;  Service: General;  Laterality: Left;    Family History  Problem Relation Age of Onset  . Colon cancer Neg Hx   . Breast cancer Neg Hx     Social History Social History  Substance Use Topics  . Smoking status: Former Smoker    Packs/day: 0.50    Years: 24.00    Types: Cigarettes    Quit date: 06/01/2016  . Smokeless tobacco: Never Used  . Alcohol use No    No Known Allergies  Current Outpatient Prescriptions  Medication Sig Dispense Refill  . albuterol (PROVENTIL HFA;VENTOLIN HFA) 108 (90 Base) MCG/ACT inhaler Inhale 1-2 puffs into the lungs every 4 (four) hours as needed for wheezing or shortness of breath. 1 Inhaler 0  . aspirin-acetaminophen-caffeine (EXCEDRIN MIGRAINE) 250-250-65 MG tablet Take 2 tablets by mouth 3 (three) times daily as needed for headache or migraine.    . cetirizine (ZYRTEC) 10 MG tablet Take 10 mg by mouth daily as needed for allergies.    Marland Kitchen. ibuprofen (ADVIL,MOTRIN) 200 MG tablet Take 800 mg by mouth 3 (three) times daily as needed for headache or moderate pain.    Marland Kitchen. oxyCODONE-acetaminophen (ROXICET) 5-325 MG tablet Take 1 tablet by mouth every 6 (six) hours as needed. 20 tablet 0   No current facility-administered medications for this  visit.     Review of Systems Review of Systems  Constitutional: Negative.   Respiratory: Negative.   Cardiovascular: Negative.     Blood pressure 122/68, pulse 78, resp. rate 14, height 5\' 9"  (1.753 m), weight 126 lb (57.2 kg).  Physical Exam Physical Exam  Constitutional: He is oriented to person, place, and time. He appears well-developed and well-nourished.  Abdominal: No hernia.    Neurological: He is alert and oriented to person, place, and time.  Skin: Skin is warm and dry.  Psychiatric: He has a normal mood and affect.    Data Reviewed Op note  Assessment  Left inguinal hernia repaired with Progrip mesh -   pt is doing well 7 days post-op    Plan No exertional activity, wait 2 more weeks before resuming full activity at work.  Follow up in 5 weeks   HPI, Physical Exam, Assessment and Plan have been scribed under the direction and in the presence of Kathreen CosierS. G. Chester Sibert, MD.  Milas Kocherebeca Morris, CMA     I have completed the exam and reviewed the above documentation for accuracy and completeness.  I agree with the above.  [employer] has been used and any errors in dictation or transcription are unintentional.  Rylynn Schoneman G. Evette CristalSankar, M.D., F.A.C.S.   [name],Baird Polinski G 09/17/2016, 11:32 AM

## 2016-09-18 ENCOUNTER — Telehealth: Payer: Self-pay | Admitting: General Surgery

## 2016-09-18 NOTE — Telephone Encounter (Signed)
I HAVE LEFT A MESSAGE ASKING HER TO LET Richard Harrington TO CALL THE OFFICE.HE HAS A CELL PHONE AND VOICE MAIL ISN'T SET UP.

## 2016-09-18 NOTE — Telephone Encounter (Signed)
I called patient and he doesn't have voice mail set up.If patient calls back please see if he wanted 9:30 or 11:00 appointment on 10-22-16.He currently has a 9:30 appointment.PLEASE TELL MELANIE OR MICHELE!!!

## 2016-10-22 ENCOUNTER — Ambulatory Visit: Payer: BLUE CROSS/BLUE SHIELD | Admitting: General Surgery

## 2018-03-03 IMAGING — DX DG CHEST 1V PORT
1 series · 2 of 2 positions shown · non-contrast
Comparison: Earlier radiograph dated 07/29/2016

CLINICAL DATA: 46-year-old male with right-sided pneumothorax
status post chest tube placement.

EXAM:
PORTABLE CHEST 1 VIEW

[Series 1: chest ap · 0.14mm/px · 2 of 2 slices shown]
[im 1/2]
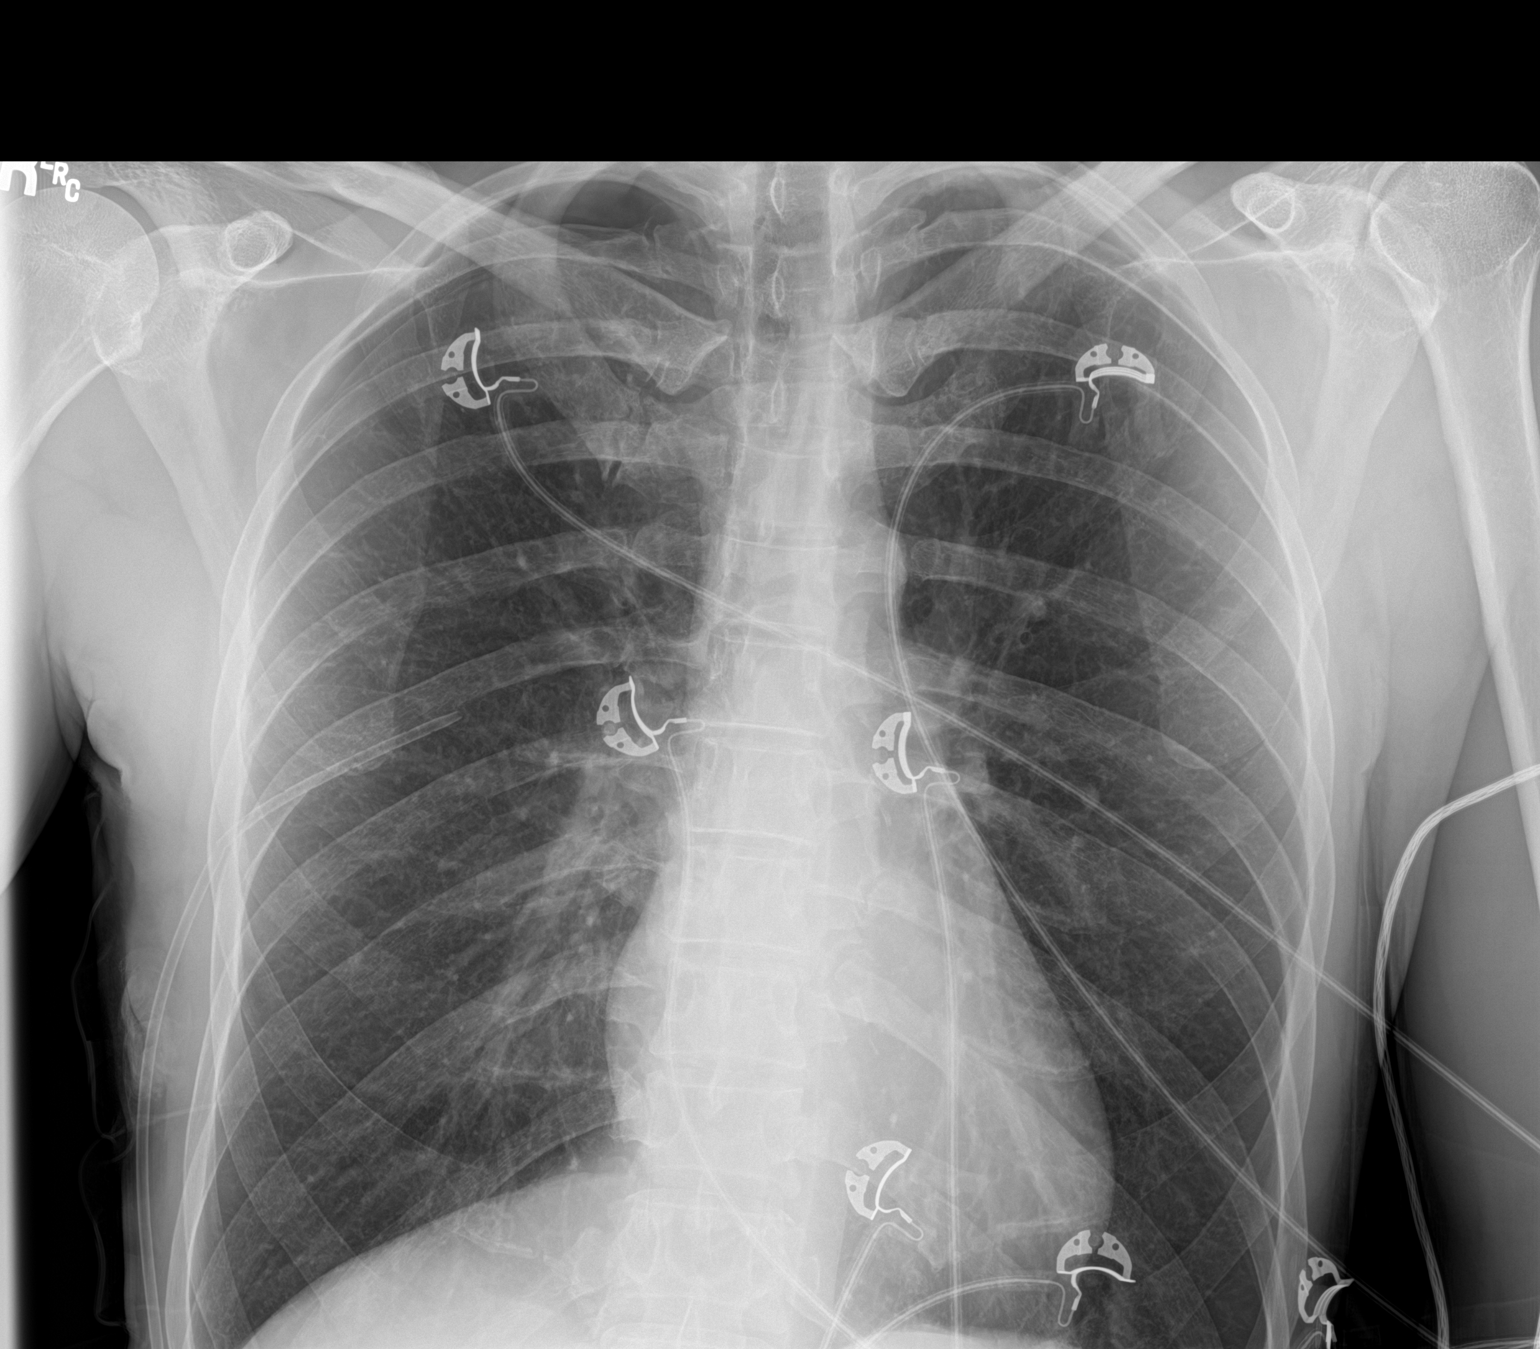
[im 2/2]
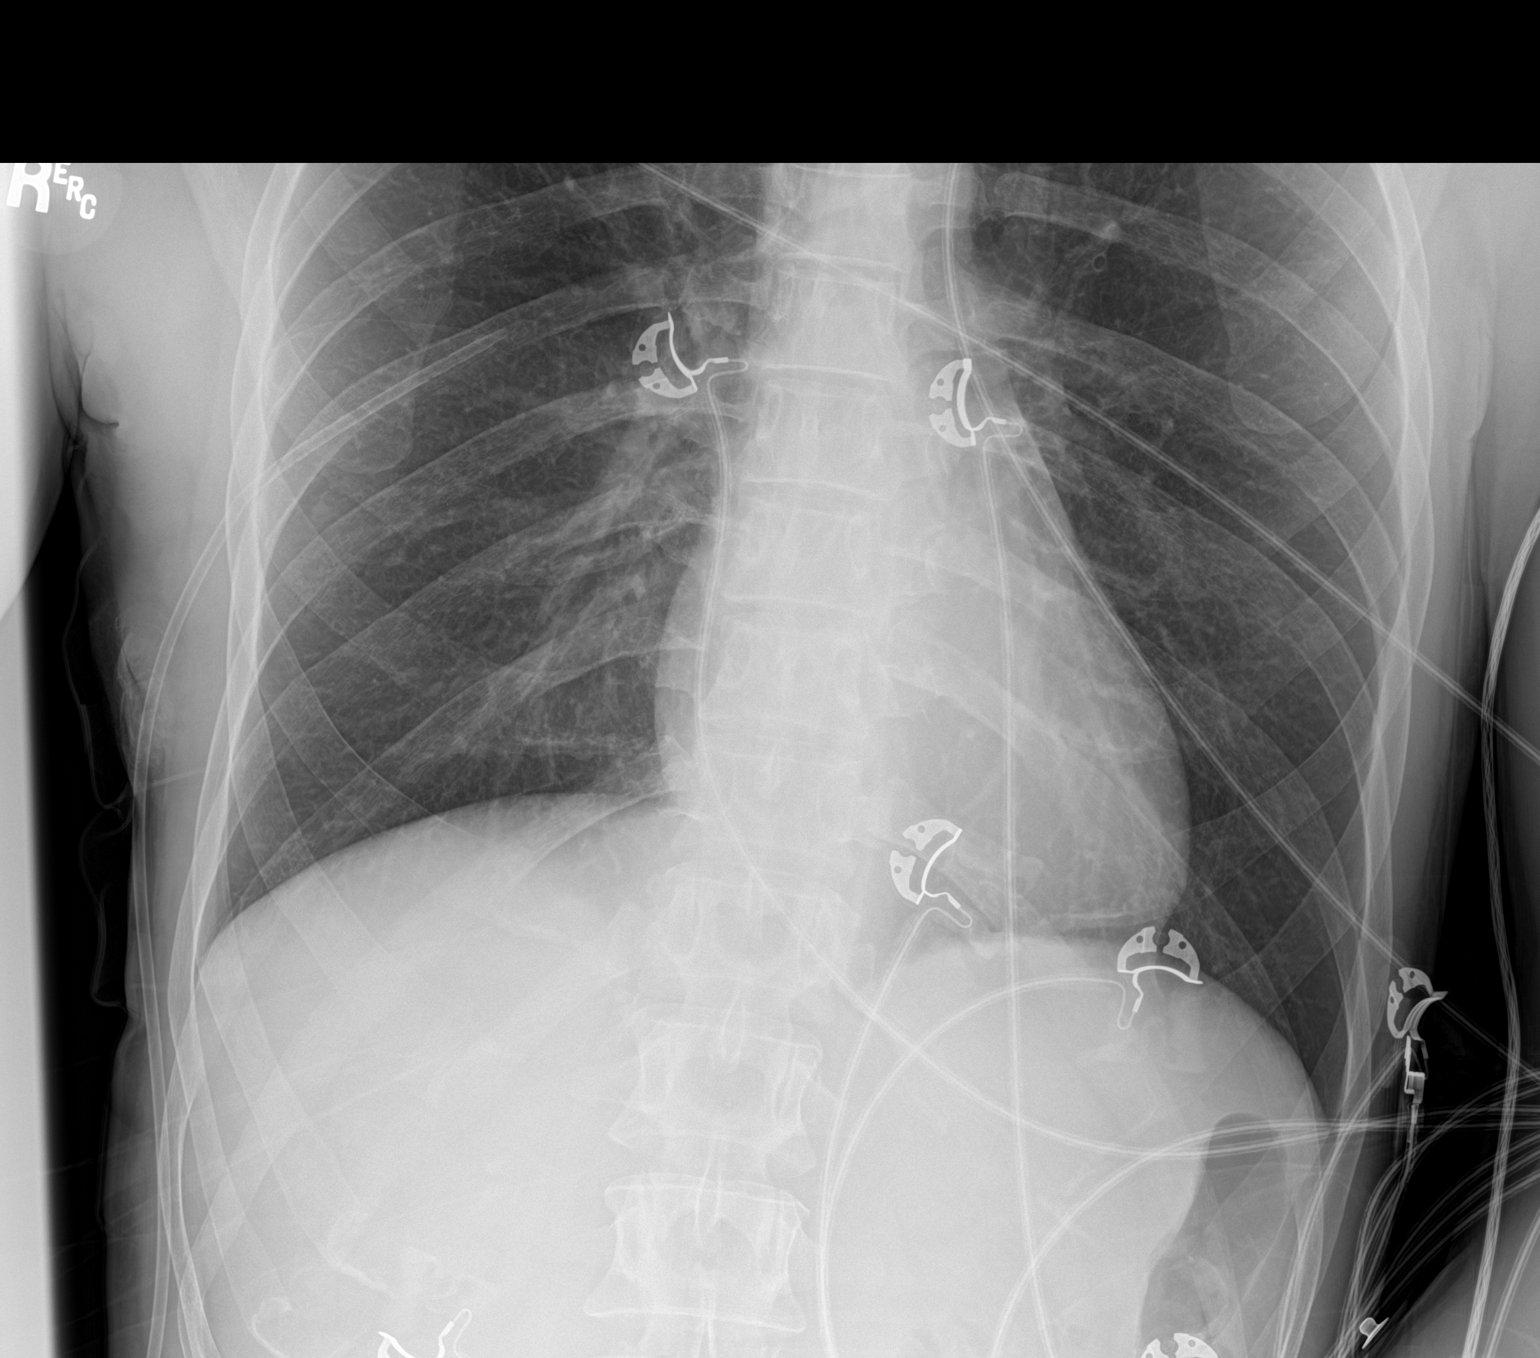

[2 of 2 positions shown; findings below may reference images not displayed]

FINDINGS: There has been interval placement of a right-sided chest tube with
tip projecting over the right seventh intercostal space. There has
been interval decrease in the size of the right-sided pneumothorax
now measuring approximately 2 cm from the apical pleural (previously
4.7 cm). The lungs are clear. There is no pleural effusion. The
cardiac silhouette is within normal limits. No acute osseous
pathology.
IMPRESSION: Interval placement of a right chest tube with decrease in the size
of the right apical pneumothorax. Continued follow-up recommended.

## 2018-03-15 IMAGING — CR DG CHEST 2V
2 series · 2 of 2 positions shown · non-contrast
Comparison: 07/31/2016.

CLINICAL DATA: Right pneumothorax.

EXAM:
CHEST  2 VIEW

[chest pa]
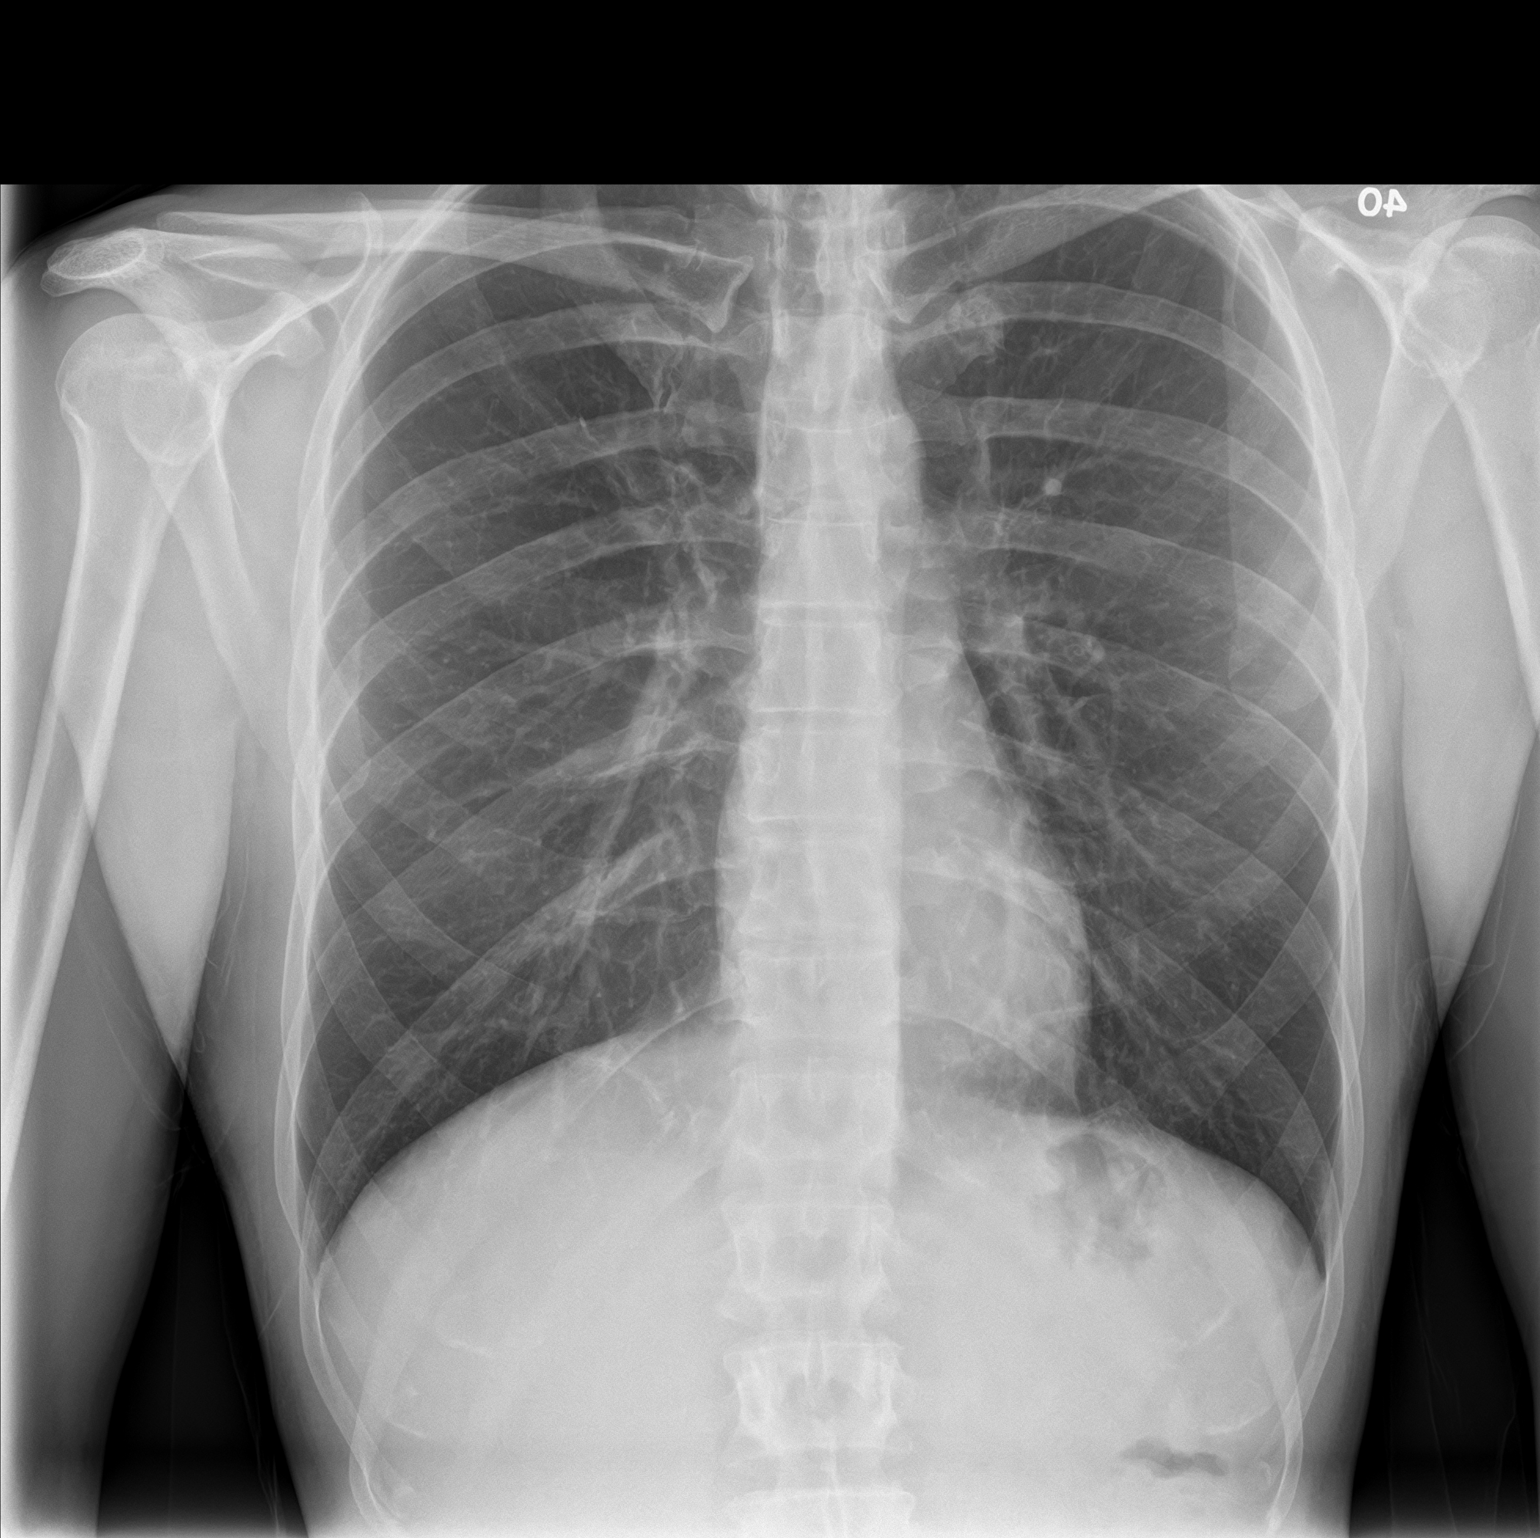

[chest lat]
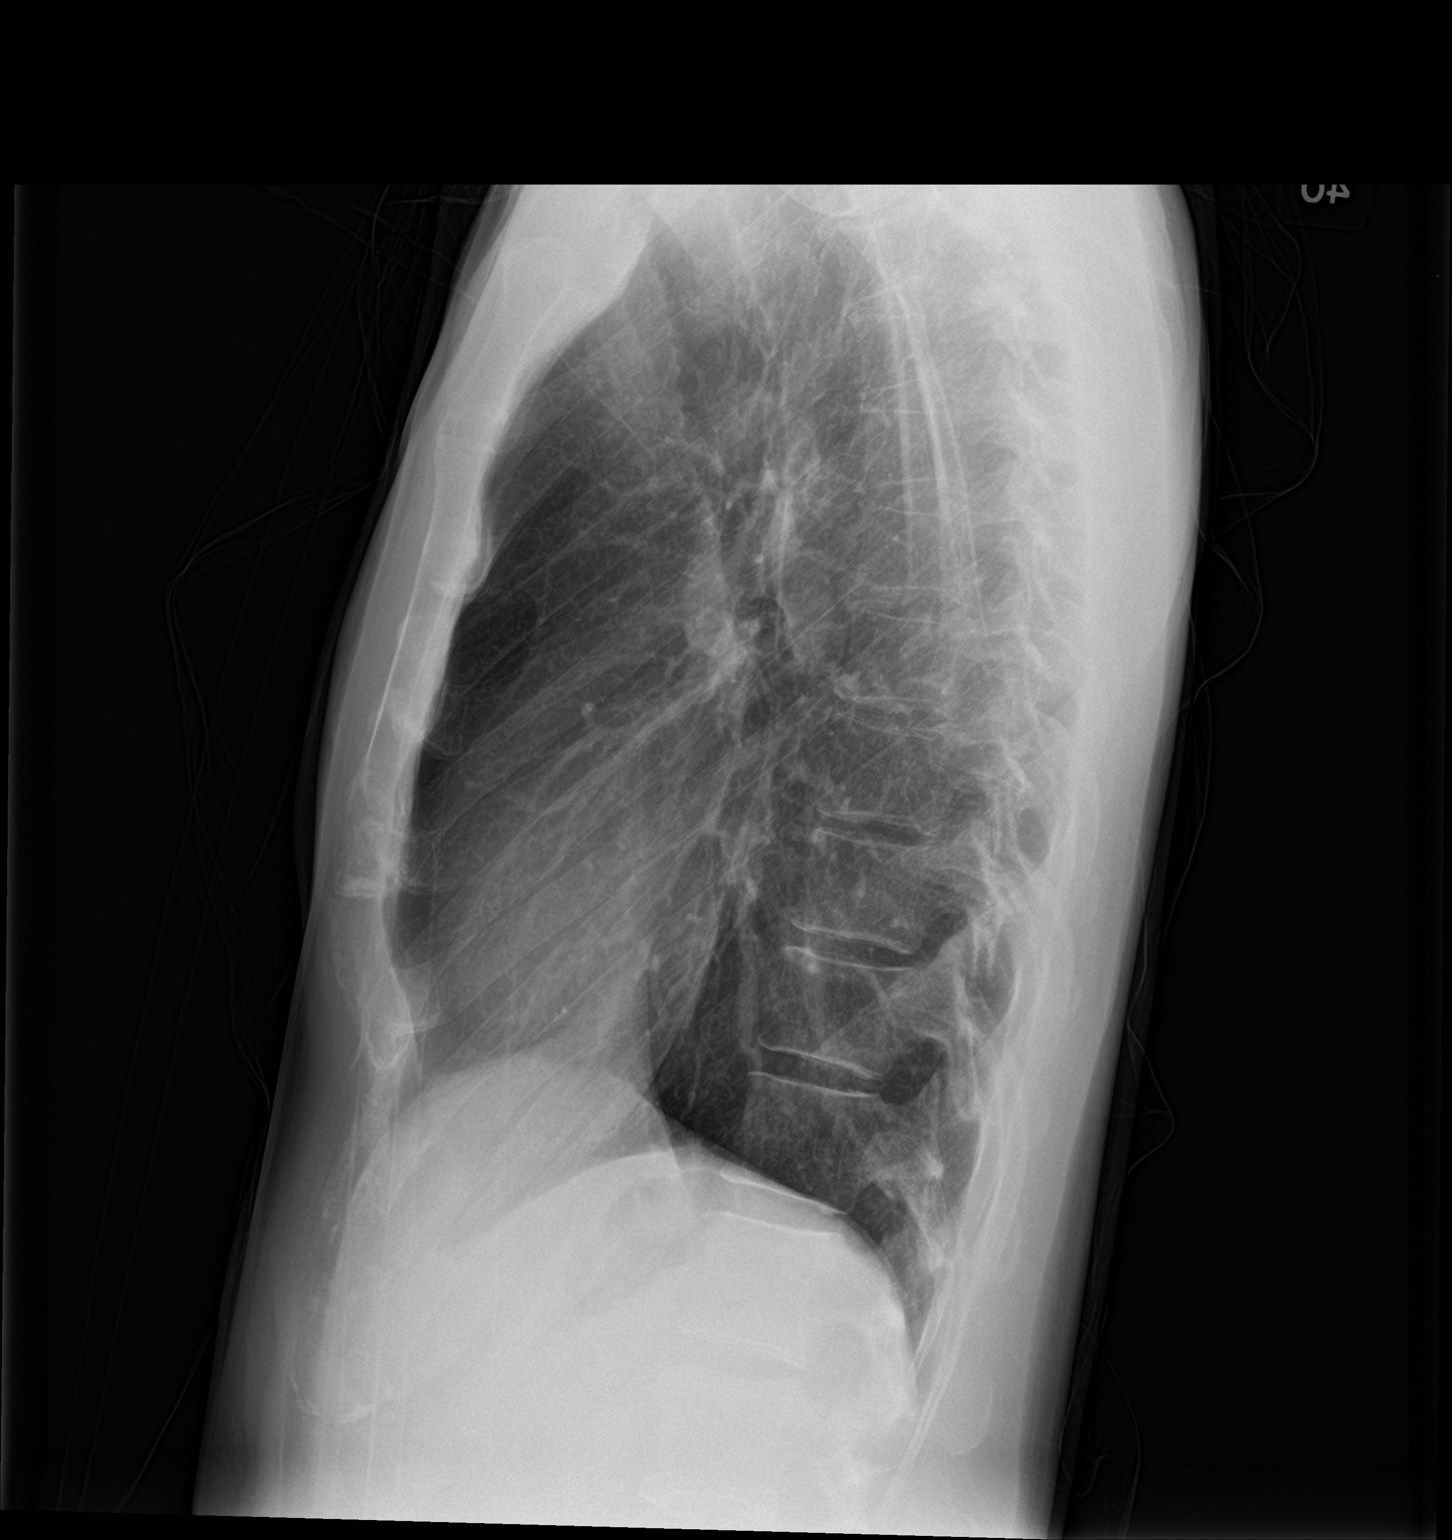

[2 of 2 positions shown; findings below may reference images not displayed]

FINDINGS: Interval removal of right chest tube. No pneumothorax. Mediastinum
hilar structures normal. Lungs are clear. No pleural effusion. Heart
size normal. No acute bony abnormality.
IMPRESSION: Interval removal of right chest tube. Interval resolution of
previously identified right pneumothorax.
